# Patient Record
Sex: Male | Born: 1965 | Race: Black or African American | Hispanic: No | Marital: Single | Smoking: Current every day smoker
Health system: Southern US, Community
[De-identification: ages and names within clinical notes are randomized; demographics above are authoritative.]

## PROBLEM LIST (undated history)

## (undated) ENCOUNTER — Emergency Department (HOSPITAL_COMMUNITY): Discharge status: Absent without leave | Payer: Medicare HMO

## (undated) DIAGNOSIS — R011 Cardiac murmur, unspecified: Secondary | ICD-10-CM

## (undated) DIAGNOSIS — K284 Chronic or unspecified gastrojejunal ulcer with hemorrhage: Secondary | ICD-10-CM

## (undated) DIAGNOSIS — F39 Unspecified mood [affective] disorder: Secondary | ICD-10-CM

## (undated) DIAGNOSIS — F419 Anxiety disorder, unspecified: Secondary | ICD-10-CM

## (undated) DIAGNOSIS — C259 Malignant neoplasm of pancreas, unspecified: Secondary | ICD-10-CM

## (undated) DIAGNOSIS — F609 Personality disorder, unspecified: Secondary | ICD-10-CM

## (undated) DIAGNOSIS — I10 Essential (primary) hypertension: Secondary | ICD-10-CM

## (undated) DIAGNOSIS — K274 Chronic or unspecified peptic ulcer, site unspecified, with hemorrhage: Secondary | ICD-10-CM

## (undated) HISTORY — DX: Cardiac murmur, unspecified: R01.1

## (undated) HISTORY — DX: Unspecified mood (affective) disorder: F39

## (undated) HISTORY — PX: TONSILLECTOMY: SUR1361

## (undated) HISTORY — PX: CERVICAL FUSION: SHX112

## (undated) HISTORY — DX: Personality disorder, unspecified: F60.9

## (undated) HISTORY — DX: Anxiety disorder, unspecified: F41.9

---

## 2001-12-02 ENCOUNTER — Emergency Department (HOSPITAL_COMMUNITY): Admission: EM | Admit: 2001-12-02 | Discharge: 2001-12-02 | Payer: Self-pay | Admitting: Emergency Medicine

## 2002-04-05 ENCOUNTER — Emergency Department (HOSPITAL_COMMUNITY): Admission: EM | Admit: 2002-04-05 | Discharge: 2002-04-06 | Payer: Self-pay | Admitting: Emergency Medicine

## 2002-04-06 ENCOUNTER — Encounter: Payer: Self-pay | Admitting: Emergency Medicine

## 2002-04-16 ENCOUNTER — Emergency Department (HOSPITAL_COMMUNITY): Admission: EM | Admit: 2002-04-16 | Discharge: 2002-04-16 | Payer: Self-pay

## 2002-04-16 ENCOUNTER — Encounter: Payer: Self-pay | Admitting: Emergency Medicine

## 2002-10-17 ENCOUNTER — Emergency Department (HOSPITAL_COMMUNITY): Admission: EM | Admit: 2002-10-17 | Discharge: 2002-10-17 | Payer: Self-pay | Admitting: Emergency Medicine

## 2002-10-18 ENCOUNTER — Emergency Department (HOSPITAL_COMMUNITY): Admission: EM | Admit: 2002-10-18 | Discharge: 2002-10-18 | Payer: Self-pay | Admitting: Emergency Medicine

## 2003-01-07 ENCOUNTER — Emergency Department (HOSPITAL_COMMUNITY): Admission: EM | Admit: 2003-01-07 | Discharge: 2003-01-07 | Payer: Self-pay | Admitting: Emergency Medicine

## 2003-01-09 ENCOUNTER — Emergency Department (HOSPITAL_COMMUNITY): Admission: EM | Admit: 2003-01-09 | Discharge: 2003-01-09 | Payer: Self-pay | Admitting: Emergency Medicine

## 2003-01-14 ENCOUNTER — Emergency Department (HOSPITAL_COMMUNITY): Admission: AD | Admit: 2003-01-14 | Discharge: 2003-01-14 | Payer: Self-pay | Admitting: Family Medicine

## 2003-12-19 ENCOUNTER — Encounter: Admission: RE | Admit: 2003-12-19 | Discharge: 2003-12-19 | Payer: Self-pay | Admitting: Orthopaedic Surgery

## 2003-12-26 ENCOUNTER — Encounter: Admission: RE | Admit: 2003-12-26 | Discharge: 2003-12-26 | Payer: Self-pay | Admitting: Orthopaedic Surgery

## 2004-03-07 ENCOUNTER — Inpatient Hospital Stay (HOSPITAL_COMMUNITY): Admission: RE | Admit: 2004-03-07 | Discharge: 2004-03-11 | Payer: Self-pay | Admitting: Orthopaedic Surgery

## 2004-03-08 ENCOUNTER — Ambulatory Visit: Payer: Self-pay | Admitting: Physical Medicine & Rehabilitation

## 2004-03-15 ENCOUNTER — Emergency Department (HOSPITAL_COMMUNITY): Admission: EM | Admit: 2004-03-15 | Discharge: 2004-03-15 | Payer: Self-pay

## 2004-03-17 ENCOUNTER — Emergency Department (HOSPITAL_COMMUNITY): Admission: EM | Admit: 2004-03-17 | Discharge: 2004-03-17 | Payer: Self-pay | Admitting: Emergency Medicine

## 2004-04-24 ENCOUNTER — Encounter: Admission: RE | Admit: 2004-04-24 | Discharge: 2004-04-24 | Payer: Self-pay | Admitting: Neurology

## 2004-05-27 ENCOUNTER — Emergency Department (HOSPITAL_COMMUNITY): Admission: EM | Admit: 2004-05-27 | Discharge: 2004-05-27 | Payer: Self-pay | Admitting: Emergency Medicine

## 2004-06-14 ENCOUNTER — Emergency Department (HOSPITAL_COMMUNITY): Admission: EM | Admit: 2004-06-14 | Discharge: 2004-06-14 | Payer: Self-pay | Admitting: Emergency Medicine

## 2004-08-16 ENCOUNTER — Emergency Department (HOSPITAL_COMMUNITY): Admission: EM | Admit: 2004-08-16 | Discharge: 2004-08-16 | Payer: Self-pay | Admitting: Family Medicine

## 2004-08-26 ENCOUNTER — Emergency Department (HOSPITAL_COMMUNITY): Admission: EM | Admit: 2004-08-26 | Discharge: 2004-08-26 | Payer: Self-pay | Admitting: Emergency Medicine

## 2004-09-22 ENCOUNTER — Emergency Department (HOSPITAL_COMMUNITY): Admission: EM | Admit: 2004-09-22 | Discharge: 2004-09-22 | Payer: Self-pay | Admitting: Emergency Medicine

## 2004-10-11 ENCOUNTER — Emergency Department (HOSPITAL_COMMUNITY): Admission: EM | Admit: 2004-10-11 | Discharge: 2004-10-11 | Payer: Self-pay | Admitting: Emergency Medicine

## 2005-09-21 ENCOUNTER — Emergency Department (HOSPITAL_COMMUNITY): Admission: AC | Admit: 2005-09-21 | Discharge: 2005-09-21 | Payer: Self-pay

## 2005-12-26 ENCOUNTER — Ambulatory Visit: Payer: Self-pay | Admitting: Nurse Practitioner

## 2005-12-29 ENCOUNTER — Ambulatory Visit: Payer: Self-pay | Admitting: *Deleted

## 2006-11-18 ENCOUNTER — Encounter (INDEPENDENT_AMBULATORY_CARE_PROVIDER_SITE_OTHER): Payer: Self-pay | Admitting: *Deleted

## 2007-02-09 ENCOUNTER — Emergency Department (HOSPITAL_COMMUNITY): Admission: EM | Admit: 2007-02-09 | Discharge: 2007-02-09 | Payer: Self-pay | Admitting: Emergency Medicine

## 2007-10-10 ENCOUNTER — Emergency Department (HOSPITAL_COMMUNITY): Admission: EM | Admit: 2007-10-10 | Discharge: 2007-10-10 | Payer: Self-pay | Admitting: Emergency Medicine

## 2008-05-23 ENCOUNTER — Emergency Department (HOSPITAL_COMMUNITY): Admission: EM | Admit: 2008-05-23 | Discharge: 2008-05-23 | Payer: Self-pay | Admitting: Emergency Medicine

## 2008-09-18 ENCOUNTER — Ambulatory Visit: Payer: Self-pay | Admitting: Diagnostic Radiology

## 2008-09-18 ENCOUNTER — Emergency Department (HOSPITAL_BASED_OUTPATIENT_CLINIC_OR_DEPARTMENT_OTHER): Admission: EM | Admit: 2008-09-18 | Discharge: 2008-09-18 | Payer: Self-pay | Admitting: Emergency Medicine

## 2008-10-08 ENCOUNTER — Emergency Department (HOSPITAL_BASED_OUTPATIENT_CLINIC_OR_DEPARTMENT_OTHER): Admission: EM | Admit: 2008-10-08 | Discharge: 2008-10-08 | Payer: Self-pay | Admitting: Emergency Medicine

## 2009-03-03 HISTORY — PX: BACK SURGERY: SHX140

## 2009-06-22 ENCOUNTER — Emergency Department (HOSPITAL_BASED_OUTPATIENT_CLINIC_OR_DEPARTMENT_OTHER): Admission: EM | Admit: 2009-06-22 | Discharge: 2009-06-22 | Payer: Self-pay | Admitting: Emergency Medicine

## 2009-09-12 ENCOUNTER — Emergency Department (HOSPITAL_BASED_OUTPATIENT_CLINIC_OR_DEPARTMENT_OTHER): Admission: EM | Admit: 2009-09-12 | Discharge: 2009-09-12 | Payer: Self-pay | Admitting: Emergency Medicine

## 2009-09-12 ENCOUNTER — Ambulatory Visit: Payer: Self-pay | Admitting: Diagnostic Radiology

## 2010-02-05 ENCOUNTER — Emergency Department (HOSPITAL_BASED_OUTPATIENT_CLINIC_OR_DEPARTMENT_OTHER)
Admission: EM | Admit: 2010-02-05 | Discharge: 2010-02-05 | Payer: Self-pay | Source: Home / Self Care | Admitting: Emergency Medicine

## 2010-03-16 ENCOUNTER — Emergency Department (HOSPITAL_COMMUNITY)
Admission: EM | Admit: 2010-03-16 | Discharge: 2010-03-16 | Payer: Self-pay | Source: Home / Self Care | Admitting: Emergency Medicine

## 2010-05-22 ENCOUNTER — Emergency Department (HOSPITAL_COMMUNITY): Payer: Self-pay

## 2010-05-22 ENCOUNTER — Emergency Department (HOSPITAL_COMMUNITY)
Admission: EM | Admit: 2010-05-22 | Discharge: 2010-05-22 | Disposition: A | Discharge status: Home / Self Care | Payer: Self-pay | Attending: Emergency Medicine | Admitting: Emergency Medicine

## 2010-05-22 DIAGNOSIS — Y9302 Activity, running: Secondary | ICD-10-CM | POA: Insufficient documentation

## 2010-05-22 DIAGNOSIS — S93409A Sprain of unspecified ligament of unspecified ankle, initial encounter: Secondary | ICD-10-CM | POA: Insufficient documentation

## 2010-05-22 DIAGNOSIS — M79609 Pain in unspecified limb: Secondary | ICD-10-CM | POA: Insufficient documentation

## 2010-05-22 DIAGNOSIS — S90129A Contusion of unspecified lesser toe(s) without damage to nail, initial encounter: Secondary | ICD-10-CM | POA: Insufficient documentation

## 2010-05-22 DIAGNOSIS — M25569 Pain in unspecified knee: Secondary | ICD-10-CM | POA: Insufficient documentation

## 2010-05-22 DIAGNOSIS — S8000XA Contusion of unspecified knee, initial encounter: Secondary | ICD-10-CM | POA: Insufficient documentation

## 2010-05-22 DIAGNOSIS — M25579 Pain in unspecified ankle and joints of unspecified foot: Secondary | ICD-10-CM | POA: Insufficient documentation

## 2010-05-22 DIAGNOSIS — W010XXA Fall on same level from slipping, tripping and stumbling without subsequent striking against object, initial encounter: Secondary | ICD-10-CM | POA: Insufficient documentation

## 2010-05-22 DIAGNOSIS — S335XXA Sprain of ligaments of lumbar spine, initial encounter: Secondary | ICD-10-CM | POA: Insufficient documentation
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
## 2010-07-19 NOTE — Op Note (Signed)
NAMEKAISON, Fernando Diaz                 ACCOUNT NO.:  [id number]   MEDICAL RECORD NO.:  [id number]          PATIENT TYPE:  INP   LOCATION:  2550                         FACILITY:  [facility]   PHYSICIAN:  Sharolyn Douglas, M.D.        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   PROCEDURE:  1.  Anterior cervical diskectomy C5-6, C6-7.  2.  Anterior cervical arthrodesis C5-6, C6-7 and placement two allograft      prosthesis spacers packed with local autogenous bone graft.  3.  Anterior cervical plating C5-C7 using the spinal Concept system.   SURGEON:  Sharolyn Douglas, M.D.   ASSISTANT:  Verlin Fester, P.A.   ANESTHESIA:  General orotracheal.   COMPLICATIONS:  None.   INDICATIONS:  The patient is a pleasant 45 year old male with persistent  neck and bilateral upper extremity pain, left greater than right. He has  severe cervical spondylosis most advanced at C5-6 and C6-7 with spinal  stenosis and neuro foraminal narrowing. He has been refractory to  conservative treatment modalities and elected to undergo a CDF at C5-6, C6-7  in hopes of improving his symptoms. He knows the risks and benefits  including adjacent segment problems requiring additional surgery.   PROCEDURE:  The patient was properly identified in the holding area, taken  to the operating room, underwent general endotracheal anesthesia without  difficulty, given prophylactic IV antibiotics. Carefully positioned with his  neck in slight extension using the [place] head rest. 5 pounds of halter  traction applied. Neck prepped and draped in the usual sterile fashion.  Transverse incision made level of cricoid cartilage. Left side of the neck  dissection was carried sharply through the platysma. The antrum between the  SCM and strap muscles medially was developed down to the  prevertebral space.  The esophagus, trachea, carotid sheath were identified and protected at all  times. C5-6 level easily identifiable by a large anterior osteophyte. Spinal  needle placed to confirm location. Longus colli muscle elevated out of C5-6  and C6-7 disk spaces bilaterally. Deep Shadowline retractor placed. Anterior  osteophytes removed with Leksell rongeur. Caspar distraction pins placed in  C5, C6, and C7 vertebral bodies. Gentle distraction applied. Starting at C5-  6 diskectomy was carried back to the posterior longitudinal ligament. The  disk space was found to be narrowed and the material degenerative. High-  speed bur used to remove the cartilaginous endplates and take down the  uncovertebral spurs. A 2 mm Kerrison used to take down the posterior  longitudinal ligament and undercut the vertebral margins. Wide  foraminotomies were completed, we felt we had a good decompression of the  spinal canal and foramen at the level of the disk. The wound was irrigated  and then a 6 mm allograft prosthesis spacer was packed with local bone graft  obtained from the drill shavings. This was carefully countersunk 1 mm into  the C5-6 disk space. We  then turned our attention to performing a similar  procedure at C6-7. Again the disk was narrowed and the material was  degenerative. The cartilaginous endplates were drilled and the uncovertebral  joint taken down. Vertebral margins undercut. Wide foraminotomies completed.  7 mm allograft prosthesis spacer packed with local bone graft placed at the  C6-7 level. The Caspar distraction pins were removed. There was a good  interference bed. The weight was removed from the head. A 40 2 mm spinal  Concepts anterior cervical plate was placed from C5-C7 with six 13 mm  screws. We had excellent screw purchase. The bone quality was. The locking  mechanism was engaged. Wound was irrigated. Esophagus, trachea, carotid  sheath inspected. No apparent  complications. Deep Penrose drain left. The  platysma closed with a running 2-0 Vicryl suture. Subcutaneous layer closed  with interrupted 3-0 Vicryl followed by a running 4-0 subcuticular Vicryl  suture on the skin edges. Benzoin, Steri-Strips placed. Sterile dressing  applied. Soft collar placed. The patient was extubated without difficulty  and transferred to recovery in stable condition able to move his upper and  lower extremities.      MC/MEDQ  D:  03/07/2004  T:  03/07/2004  Job:  [id number]
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
## 2010-10-29 ENCOUNTER — Ambulatory Visit (HOSPITAL_BASED_OUTPATIENT_CLINIC_OR_DEPARTMENT_OTHER): Payer: Medicaid Other | Admitting: Physical Medicine & Rehabilitation

## 2010-10-29 ENCOUNTER — Encounter: Payer: Medicaid Other | Attending: Physical Medicine & Rehabilitation

## 2010-10-29 DIAGNOSIS — M961 Postlaminectomy syndrome, not elsewhere classified: Secondary | ICD-10-CM | POA: Insufficient documentation

## 2010-10-29 DIAGNOSIS — Z981 Arthrodesis status: Secondary | ICD-10-CM | POA: Insufficient documentation

## 2010-10-29 DIAGNOSIS — M542 Cervicalgia: Secondary | ICD-10-CM | POA: Insufficient documentation

## 2010-10-29 DIAGNOSIS — M79609 Pain in unspecified limb: Secondary | ICD-10-CM | POA: Insufficient documentation

## 2010-10-29 DIAGNOSIS — M549 Dorsalgia, unspecified: Secondary | ICD-10-CM | POA: Insufficient documentation

## 2010-10-29 DIAGNOSIS — R29898 Other symptoms and signs involving the musculoskeletal system: Secondary | ICD-10-CM | POA: Insufficient documentation

## 2010-11-11 NOTE — Progress Notes (Signed)
REASON FOR VISIT:  Neck pain and back pain.  HISTORY:  A 45 year old male who had originally injured himself in 2005. He had neck pain in right upper extremity pain as well as weakness in the left upper extremity evaluated by Dr. Sharolyn Douglas found to have a C5- C6, C6-C7 disk underwent ACDF at those levels March 07, 2004.  No six. He had no postoperative complications.  He has had subsequent surgery cervical level.  He thinks it was at a different level than the original, but I do not have those reports given that were done at [facility].  He has had ED visits postoperatively in 2006 as well as 2008 and 2009 primarily for neck pain.  He had his repeat surgery in 2010 on his neck.  He then underwent a lumbar fusion at L4-5 per Dr. Sharolyn Douglas in n August 2011.  His last lumbar spine films showed an L3-4 fusion read as.  However, in view of his last MRI that I had on disk looked well decompressed.  No adjacent level degeneration.  Looking through ED visits, no alcohol related visits.  He was going to a Pain Clinic, but had a breathalyzer test showing 0.006 alcohol level. He states that he was using mouth wash before his visit.  He does have a history of dental caries.  He denies any alcohol use.  He denies any drug use.  Oswestry score is 64%.  ALLERGIES:  To MORPHINE.  Currently out of pain medicine, has taken some amoxicillin for abscess tooth, tramadol as well as trazodone.  He does not have any tramadol left either.  His pain level is 7/10 including neck, shoulder area as well as back and left thigh.  He needs some help with meal prep, household duties and shopping.  He has bladder and bowel issues, chronic numbness, trouble walking, depression and coughing.  Denies smoking or drinking.  Denies illegal drug use.  PHYSICAL EXAMINATION:  VITAL SIGNS:  Blood pressure 142/92, pulse 73, respirations 20 and O2 sat 100% on room air. GENERAL:  No acute distress.  Mood  and affect appropriate. BACK:  His back has no tenderness to palpation along the lumbar paraspinal muscles.  No problems with his incisions.  Neck incisions looked well-healed.  Mildly hypertrophic scar.  Neck range of motion is limited by 25% forward flexion, extension, lateral rotation and bending. Lumbar forward flexion is about 50%, but when he straightens up he complains of catch with pain in his low back area.  Straight leg raising test is negative.  Deep tendon reflexes are normal.  Upper and lower extremity sensation normal.  Upper and lower extremity strength is normal.  Upper and lower extremity gait is slow.  He shuffles slightly widened base support.  No evidence of toe drag or knee instability.  IMPRESSION: 1. Lumbar post laminectomy syndrome.  This is her primary pain     complaint. 2. Cervical post laminectomy syndrome. 3. Questionable EtOH abuse.  He denies and there is no other medical     history to substantiate this.  He did have very low level     breathalyzer, questioned false positive.  I think at this point, we     will check urine drug screen including test for ethanol and if this     is negative for any illicit drugs, alcohol or non disclosed     opiates, we can trial him on his Norco that he states was working  well for him before and watch him closely with pill counts as well     as subsequent UDS. 4. I really do not think any formalized physical therapy needs at this     time, perhaps he would benefit from a TENS.  In terms of     interventional procedures, he may benefit from sacroiliac injection     versus facet injections. 5. See in [facility] 1 month.  If he has positive UDS, we will     really have nothing else to offer him given that he does not want     to try just nonnarcotic treatment program.     Erick Colace, M.D. Electronically Signed    AEK/MedQ D:  10/29/2010 11:21:20  T:  10/29/2010 12:53:51  Job #:  [id number]  cc:    Valetta Close, M.D.  Jackie Plum, M.D. Fax: [phone]

## 2010-11-26 ENCOUNTER — Encounter: Payer: Medicaid Other | Attending: Neurosurgery | Admitting: Neurosurgery

## 2010-11-26 DIAGNOSIS — M542 Cervicalgia: Secondary | ICD-10-CM | POA: Insufficient documentation

## 2010-11-26 DIAGNOSIS — M545 Low back pain, unspecified: Secondary | ICD-10-CM | POA: Insufficient documentation

## 2010-11-26 DIAGNOSIS — M961 Postlaminectomy syndrome, not elsewhere classified: Secondary | ICD-10-CM

## 2010-11-26 DIAGNOSIS — M25519 Pain in unspecified shoulder: Secondary | ICD-10-CM | POA: Insufficient documentation

## 2010-11-26 NOTE — Assessment & Plan Note (Signed)
HISTORY OF PRESENT ILLNESS:  This is a patient of Dr. Wynn Banker.  He followed up last month regarding some back and neck and shoulder pain. The patient reports no change in his pain but was here to follow up on his UDS which was consistent.  He rates his pain as 7-8.  General activity level was 10.  Pain is worse during the day and night.  Walking and sitting tend to aggravate.  Medication tends to help.  He does use a cane for ambulation.  He does not climb steps or drive.  He can walk about 15 minutes at a time.  He is on disability.  He needs help with meal prep, household duties, and shopping.  REVIEW OF SYSTEMS:  Notable for those difficulties as well as some weakness, bowel control issues, numbness, trouble with ambulation, spasms, sleep apnea, coughing, abdominal pain, diarrhea, and some depression.  No suicidal thoughts.  PAST MEDICAL HISTORY:  Unchanged.  SOCIAL HISTORY:  Single, lives alone.  FAMILY HISTORY:  Unchanged.  PHYSICAL EXAMINATION:  VITAL SIGNS:  His blood pressure is 157/86, pulse 111, respirations 18, and O2 sats 99 on room air. NEUROLOGIC:  His motor strength is 4.5/5 in lower extremities. Sensation appears to be intact.  Constitutionally, he is within normal limits.  He is alert and oriented x3 but slow to rise from seated position.  He does walk with a limp.  IMPRESSION: 1. Post laminectomy syndrome, lumbar. 2. Cervical post laminectomy syndrome. 3. He has consistent urine drug screen.  PLAN:  We will go ahead and start him on Norco 5/325 one p.o. b.i.d., 60 with no refill.  Otherwise, his questions were encouraged and answered. He will follow up here in the clinic with me in 1 month.     Raychell Holcomb L. Blima Dessert Electronically Signed    RLW/MedQ D:  11/26/2010 12:56:35  T:  11/26/2010 13:45:28  Job #:  [id number]

## 2010-11-29 LAB — RAPID URINE DRUG SCREEN, HOSP PERFORMED
Amphetamines: NOT DETECTED
Barbiturates: NOT DETECTED
Benzodiazepines: NOT DETECTED
Cocaine: POSITIVE — AB
Opiates: NOT DETECTED
Tetrahydrocannabinol: NOT DETECTED

## 2010-11-29 LAB — POCT CARDIAC MARKERS
CKMB, poc: 2.2
CKMB, poc: 2.4
CKMB, poc: 3
Myoglobin, poc: 54.6
Myoglobin, poc: 56.3
Myoglobin, poc: 92.7
Troponin i, poc: <0.05
Troponin i, poc: <0.05
Troponin i, poc: <0.05

## 2010-11-29 LAB — DIFFERENTIAL
Basophils Absolute: 0
Basophils Relative: 1
Eosinophils Absolute: 0.2
Eosinophils Relative: 4
Lymphocytes Relative: 29
Lymphs Abs: 1.2
Monocytes Absolute: 0.3
Monocytes Relative: 7
Neutro Abs: 2.5
Neutrophils Relative %: 59

## 2010-11-29 LAB — CBC
HCT: 37.6 — ABNORMAL LOW
Hemoglobin: 12.6 — ABNORMAL LOW
MCHC: 33.5
MCV: 85.2
Platelets: 192
RBC: 4.41
RDW: 14.6
WBC: 4.2

## 2010-11-29 LAB — BASIC METABOLIC PANEL
BUN: 5 — ABNORMAL LOW
CO2: 25
Calcium: 8.7
GFR calc non Af Amer: >60
Glucose, Bld: 99
Sodium: 130 — ABNORMAL LOW

## 2010-11-29 LAB — BASIC METABOLIC PANEL WITH GFR
Chloride: 99
Creatinine, Ser: 0.87
GFR calc Af Amer: >60
Potassium: 3.4 — ABNORMAL LOW

## 2010-12-18 ENCOUNTER — Ambulatory Visit: Payer: Medicaid Other | Admitting: Neurosurgery

## 2010-12-18 ENCOUNTER — Encounter: Payer: Medicare Other | Admitting: Neurosurgery

## 2010-12-18 DIAGNOSIS — R209 Unspecified disturbances of skin sensation: Secondary | ICD-10-CM | POA: Insufficient documentation

## 2010-12-18 DIAGNOSIS — M961 Postlaminectomy syndrome, not elsewhere classified: Secondary | ICD-10-CM | POA: Insufficient documentation

## 2010-12-18 DIAGNOSIS — M62838 Other muscle spasm: Secondary | ICD-10-CM | POA: Insufficient documentation

## 2010-12-18 DIAGNOSIS — M549 Dorsalgia, unspecified: Secondary | ICD-10-CM | POA: Insufficient documentation

## 2010-12-18 DIAGNOSIS — M542 Cervicalgia: Secondary | ICD-10-CM | POA: Insufficient documentation

## 2010-12-18 DIAGNOSIS — M25519 Pain in unspecified shoulder: Secondary | ICD-10-CM | POA: Insufficient documentation

## 2010-12-20 ENCOUNTER — Encounter: Payer: Medicare Other | Attending: Neurosurgery | Admitting: Neurosurgery

## 2010-12-20 DIAGNOSIS — M545 Low back pain, unspecified: Secondary | ICD-10-CM | POA: Insufficient documentation

## 2010-12-20 DIAGNOSIS — M25519 Pain in unspecified shoulder: Secondary | ICD-10-CM | POA: Insufficient documentation

## 2010-12-20 DIAGNOSIS — M542 Cervicalgia: Secondary | ICD-10-CM | POA: Insufficient documentation

## 2010-12-20 DIAGNOSIS — M961 Postlaminectomy syndrome, not elsewhere classified: Secondary | ICD-10-CM | POA: Insufficient documentation
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
## 2011-01-31 ENCOUNTER — Encounter: Payer: Medicare Other | Attending: Neurosurgery

## 2011-01-31 ENCOUNTER — Ambulatory Visit (HOSPITAL_BASED_OUTPATIENT_CLINIC_OR_DEPARTMENT_OTHER): Payer: Medicaid Other | Admitting: Physical Medicine & Rehabilitation

## 2011-01-31 DIAGNOSIS — M25519 Pain in unspecified shoulder: Secondary | ICD-10-CM | POA: Insufficient documentation

## 2011-01-31 DIAGNOSIS — M545 Low back pain, unspecified: Secondary | ICD-10-CM | POA: Insufficient documentation

## 2011-01-31 DIAGNOSIS — M542 Cervicalgia: Secondary | ICD-10-CM | POA: Insufficient documentation

## 2011-01-31 DIAGNOSIS — M961 Postlaminectomy syndrome, not elsewhere classified: Secondary | ICD-10-CM | POA: Insufficient documentation

## 2011-01-31 NOTE — Assessment & Plan Note (Signed)
REASON FOR VISIT:  Lumbar post laminectomy syndrome and cervical post laminectomy syndrome with chronic low back greater than neck pain.  A 45 year old male with history of a C5-C6 and C6-C7 disk herniations who underwent ACDF at those levels March 07, 2004.  He had no postoperative complications, but underwent subsequent surgery perhaps at another level.  He also underwent a lumbar fusion L4-L5 level, Max Cohen August 2011.  He has had no new medical problems in the interval time period.  His back pain does limit him in terms of his mobility.  His neck pain is aggravating sometimes going to his left shoulder.  His Oswestry score was 64% unchanged compared to prior.  He did not do a neck disability index.  His examination shows pain with extension of the lumbar spine, but not with flexion.  He has no significant tenderness in the paraspinal muscles, either in the cervical or lumbar area.  He does have some left upper trapezius tenderness.  His left shoulder has negative impingement sign.  Negative cross adduction test.  He has full range of motion.  His motor strength is 5/5 bilateral upper extremities with the exception of left deltoid, which is 4-/5 and partly limited by pain.  We trialed him on a muscle relaxant that made him feel "out of body." He has been out of his Norco for about 4-5 days.  His last fill date was December 20, 2010, so that this would be appropriate.  He states that the medication was helpful in alleviating his pain, the Norco 5/325.  His pain level is 7/10 described as burning, stabbing, aching both in the neck and back.  He could walk 50 minutes at a time.  He does climb steps.  He does not drive.  His review of systems positive for trouble walking, depression, anxiety. He does wear a back brace.  Social; single, nonsmoker, nondrinker.  Blood pressure 144/98, pulse 117, respirations 18, O2 sat 100% on room air, weight 195 pounds, height 71 inches.  In  general, no acute distress.  Mood and affect appropriate.  Gait is with a cane.  He has no evidence of toe drag or knee instability.  His neck range of motion is about 75% of normal range forward flexion, extension, right lateral bending and left lateral bending is 50% of normal as well as left rotation.  IMPRESSION: 1. Cervical post laminectomy syndrome.  This is resulting in some     shoulder pain.  I think this is referred rather than primary     shoulder. 2. Lumbar pain.  I think this is actually the more disabling of his     pain syndromes and his exam is consistent with lumbar facet     arthropathy, so he would be a good candidate for medial branch     blocks.  We will set him up for those 3. Norco 5/325 b.i.d.  No signs of aberrant drug behavior.  We will     continue monitor.     Erick Colace, M.D. Electronically Signed   AEK/MedQ D:  01/31/2011 13:08:09  T:  01/31/2011 22:53:47  Job #:  [id number]

## 2011-02-17 ENCOUNTER — Encounter: Payer: Medicaid Other | Admitting: Physical Medicine & Rehabilitation

## 2011-02-17 ENCOUNTER — Ambulatory Visit: Payer: Medicaid Other | Admitting: Physical Medicine & Rehabilitation

## 2011-03-03 ENCOUNTER — Encounter: Payer: Medicare Other | Attending: Neurosurgery

## 2011-03-03 ENCOUNTER — Ambulatory Visit: Payer: Medicaid Other | Admitting: Physical Medicine & Rehabilitation

## 2011-03-03 DIAGNOSIS — M961 Postlaminectomy syndrome, not elsewhere classified: Secondary | ICD-10-CM | POA: Insufficient documentation

## 2011-03-03 DIAGNOSIS — M542 Cervicalgia: Secondary | ICD-10-CM | POA: Insufficient documentation

## 2011-03-03 DIAGNOSIS — M25519 Pain in unspecified shoulder: Secondary | ICD-10-CM | POA: Insufficient documentation

## 2011-03-03 DIAGNOSIS — M545 Low back pain, unspecified: Secondary | ICD-10-CM | POA: Insufficient documentation

## 2011-03-06 ENCOUNTER — Encounter: Payer: Medicare Other | Attending: Neurosurgery | Admitting: Neurosurgery

## 2011-03-06 DIAGNOSIS — M961 Postlaminectomy syndrome, not elsewhere classified: Secondary | ICD-10-CM | POA: Insufficient documentation

## 2011-03-06 DIAGNOSIS — M545 Low back pain, unspecified: Secondary | ICD-10-CM | POA: Insufficient documentation

## 2011-03-06 DIAGNOSIS — M25519 Pain in unspecified shoulder: Secondary | ICD-10-CM | POA: Insufficient documentation

## 2011-03-06 DIAGNOSIS — M542 Cervicalgia: Secondary | ICD-10-CM | POA: Insufficient documentation

## 2011-03-10 ENCOUNTER — Ambulatory Visit: Payer: Medicare Other | Admitting: Physical Medicine & Rehabilitation

## 2011-04-02 ENCOUNTER — Other Ambulatory Visit: Payer: Self-pay | Admitting: Orthopaedic Surgery

## 2011-04-02 DIAGNOSIS — M542 Cervicalgia: Secondary | ICD-10-CM

## 2011-04-02 DIAGNOSIS — M549 Dorsalgia, unspecified: Secondary | ICD-10-CM

## 2011-05-16 ENCOUNTER — Emergency Department (HOSPITAL_BASED_OUTPATIENT_CLINIC_OR_DEPARTMENT_OTHER)
Admission: EM | Admit: 2011-05-16 | Discharge: 2011-05-16 | Disposition: A | Discharge status: Home / Self Care | Payer: Medicare Other | Attending: Emergency Medicine | Admitting: Emergency Medicine

## 2011-05-16 ENCOUNTER — Encounter (HOSPITAL_BASED_OUTPATIENT_CLINIC_OR_DEPARTMENT_OTHER): Payer: Self-pay | Admitting: *Deleted

## 2011-05-16 DIAGNOSIS — K0889 Other specified disorders of teeth and supporting structures: Secondary | ICD-10-CM

## 2011-05-16 DIAGNOSIS — F17200 Nicotine dependence, unspecified, uncomplicated: Secondary | ICD-10-CM | POA: Insufficient documentation

## 2011-05-16 DIAGNOSIS — K089 Disorder of teeth and supporting structures, unspecified: Secondary | ICD-10-CM | POA: Insufficient documentation

## 2011-05-16 MED ORDER — TRAMADOL HCL 50 MG PO TABS: 50.0000 mg | ORAL_TABLET | Freq: Four times a day (QID) | ORAL | Status: AC | PRN

## 2011-05-16 MED ORDER — PENICILLIN V POTASSIUM 250 MG PO TABS: 250.0000 mg | ORAL_TABLET | Freq: Four times a day (QID) | ORAL | Status: AC

## 2011-05-16 NOTE — Discharge Instructions (Signed)
Toothache Toothaches are usually caused by tooth decay (cavity). However, other causes of toothache include:  Gum disease.   Cracked tooth.   Cracked filling.   Injury.   Jaw problem (temporo mandibular joint or TMJ disorder).   Tooth abscess.   Root sensitivity.   Grinding.   Eruption problems.  Swelling and redness around a painful tooth often means you have a dental abscess. Pain medicine and antibiotics can help reduce symptoms, but you will need to see a dentist within the next few days to have your problem properly evaluated and treated. If tooth decay is the problem, you may need a filling or root canal to save your tooth. If the problem is more severe, your tooth may need to be pulled. SEEK IMMEDIATE MEDICAL CARE IF:  You cannot swallow.   You develop severe swelling, increased redness, or increased pain in your mouth or face.   You have a fever.   You cannot open your mouth adequately.  Document Released: 03/27/2004 Document Revised: 02/06/2011 Document Reviewed: 05/17/2009 Exit[employer] Patient Information 2012 [employer]. 

## 2011-05-16 NOTE — ED Notes (Signed)
Dental pain x 1 week. Right lower molar pain.

## 2011-05-16 NOTE — ED Provider Notes (Signed)
History     CSN: [id number]  Arrival date & time 05/16/11  1342   First MD Initiated Contact with Patient 05/16/11 1346      Chief Complaint  Patient presents with  . Dental Pain    (Consider location/radiation/quality/duration/timing/severity/associated sxs/prior treatment) HPI Comments: Pt c/o right lower dental pain time one week:pt states that the area is starting to swell  Patient is a 46 y.o. male presenting with tooth pain. The history is provided by the patient. No language interpreter was used.  Dental PainThe primary symptoms include mouth pain. Primary symptoms do not include headaches or fever. The symptoms began 5 to 7 days ago. The symptoms are worsening. The symptoms are new. The symptoms occur constantly.    History reviewed. No pertinent past medical history.  History reviewed. No pertinent past surgical history.  No family history on file.  History  Substance Use Topics  . Smoking status: Current Everyday Smoker -- 1.0 packs/day  . Smokeless tobacco: Not on file  . Alcohol Use: No      Review of Systems  Constitutional: Negative for fever.  HENT: Positive for dental problem.   Eyes: Negative.   Respiratory: Negative.   Cardiovascular: Negative.   Neurological: Negative for headaches.    Allergies  Review of patient's allergies indicates no known allergies.  Home Medications  No current outpatient prescriptions on file.  BP 126/87  Pulse 80  Temp(Src) 98 F (36.7 C) (Oral)  Resp 20  Wt 197 lb (89.359 kg)  SpO2 100%  Physical Exam  Nursing note and vitals reviewed. Constitutional: He appears well-developed and well-nourished.  HENT:  Head: Normocephalic.  Right Ear: External ear normal.  Left Ear: External ear normal.       Pt has redness and swelling noted to the right lower gum:no fluctuance to the area  Cardiovascular: Normal rate and regular rhythm.   Pulmonary/Chest: Effort normal and breath sounds normal.  Musculoskeletal:  Normal range of motion.  Neurological: He is alert.    ED Course  Procedures (including critical care time)  Labs Reviewed - No data to display No results found.   1. Toothache       MDM  Will treat for a toothache        Teressa Lower, NP 05/16/11 1414

## 2011-05-16 NOTE — ED Notes (Signed)
Right side of his face is swollen. C.o pain to his lower right molar.

## 2011-05-18 NOTE — ED Provider Notes (Signed)
Medical screening examination/treatment/procedure(s) were performed by non-physician practitioner and as supervising physician I was immediately available for consultation/collaboration.   Geoffery Lyons, MD 05/18/11 2123

## 2011-10-08 ENCOUNTER — Emergency Department (HOSPITAL_BASED_OUTPATIENT_CLINIC_OR_DEPARTMENT_OTHER)
Admission: EM | Admit: 2011-10-08 | Discharge: 2011-10-08 | Disposition: A | Discharge status: Home / Self Care | Payer: Medicare Other | Attending: Emergency Medicine | Admitting: Emergency Medicine

## 2011-10-08 ENCOUNTER — Encounter (HOSPITAL_BASED_OUTPATIENT_CLINIC_OR_DEPARTMENT_OTHER): Payer: Self-pay | Admitting: Family Medicine

## 2011-10-08 DIAGNOSIS — R195 Other fecal abnormalities: Secondary | ICD-10-CM | POA: Insufficient documentation

## 2011-10-08 DIAGNOSIS — K2970 Gastritis, unspecified, without bleeding: Secondary | ICD-10-CM

## 2011-10-08 DIAGNOSIS — R109 Unspecified abdominal pain: Secondary | ICD-10-CM | POA: Insufficient documentation

## 2011-10-08 DIAGNOSIS — Z8509 Personal history of malignant neoplasm of other digestive organs: Secondary | ICD-10-CM | POA: Insufficient documentation

## 2011-10-08 DIAGNOSIS — F17200 Nicotine dependence, unspecified, uncomplicated: Secondary | ICD-10-CM | POA: Insufficient documentation

## 2011-10-08 HISTORY — DX: Chronic or unspecified peptic ulcer, site unspecified, with hemorrhage: K27.4

## 2011-10-08 HISTORY — DX: Malignant neoplasm of pancreas, unspecified: C25.9

## 2011-10-08 HISTORY — DX: Chronic or unspecified gastrojejunal ulcer with hemorrhage: K28.4

## 2011-10-08 LAB — CBC WITH DIFFERENTIAL/PLATELET
Basophils Relative: 0 % (ref 0–1)
Eosinophils Absolute: 0.1 10*3/uL (ref 0.0–0.7)
Eosinophils Relative: 2 % (ref 0–5)
HCT: 38.1 % — ABNORMAL LOW (ref 39.0–52.0)
Hemoglobin: 13.7 g/dL (ref 13.0–17.0)
MCH: 28.2 pg (ref 26.0–34.0)
MCHC: 36 g/dL (ref 30.0–36.0)
Monocytes Absolute: 0.3 10*3/uL (ref 0.1–1.0)
Monocytes Relative: 7 % (ref 3–12)
RDW: 14.8 % (ref 11.5–15.5)

## 2011-10-08 MED ORDER — MORPHINE SULFATE 4 MG/ML IJ SOLN
4.0000 mg | Freq: Once | INTRAMUSCULAR | Status: DC
  Filled 2011-10-08: qty 1

## 2011-10-08 MED ORDER — SODIUM CHLORIDE 0.9 % IV BOLUS (SEPSIS): 1000.0000 mL | Freq: Once | INTRAVENOUS | Status: DC

## 2011-10-08 MED ORDER — ONDANSETRON 4 MG PO TBDP
4.0000 mg | ORAL_TABLET | Freq: Once | ORAL | Status: AC
  Administered 2011-10-08: 4 mg via ORAL
  Filled 2011-10-08: qty 1

## 2011-10-08 MED ORDER — ONDANSETRON HCL 4 MG PO TABS: 4.0000 mg | ORAL_TABLET | Freq: Three times a day (TID) | ORAL | Status: AC | PRN

## 2011-10-08 MED ORDER — PANTOPRAZOLE SODIUM 40 MG IV SOLR
40.0000 mg | Freq: Once | INTRAVENOUS | Status: DC
  Filled 2011-10-08: qty 40

## 2011-10-08 MED ORDER — FAMOTIDINE 40 MG PO TABS: 40.0000 mg | ORAL_TABLET | Freq: Every day | ORAL | Status: DC

## 2011-10-08 MED ORDER — TRAMADOL HCL 50 MG PO TABS: 50.0000 mg | ORAL_TABLET | Freq: Four times a day (QID) | ORAL | Status: AC | PRN

## 2011-10-08 MED ORDER — OXYCODONE-ACETAMINOPHEN 5-325 MG PO TABS
2.0000 | ORAL_TABLET | Freq: Once | ORAL | Status: AC
  Administered 2011-10-08: 2 via ORAL
  Filled 2011-10-08 (×2): qty 2

## 2011-10-08 MED ORDER — ONDANSETRON HCL 4 MG/2ML IJ SOLN
4.0000 mg | Freq: Once | INTRAMUSCULAR | Status: DC
  Filled 2011-10-08: qty 2

## 2011-10-08 NOTE — ED Provider Notes (Signed)
History     CSN: [id number]  Arrival date & time 10/08/11  1212   First MD Initiated Contact with Patient 10/08/11 1313      Chief Complaint  Patient presents with  . Abdominal Pain    (Consider location/radiation/quality/duration/timing/severity/associated sxs/prior treatment) HPI  46 y/o male with PMH significant for prior EtOH abuse and gastric ulcer c/o epigastric colicky pain gnawing 10/10  worsening over the course of 3 days. Not tied to food intake.  Associated with multiple episodes of nonbloody nonbilious vomiting. Patient is not tolerating by mouth at this moment. Pt also reports BRBPR x2 days. Pt took ibuprofen with little relief. Denies chest pain, shortness of breath, lightheaded on standing or palpitations.   GI Dr. Nedra Hai  Past Medical History  Diagnosis Date  . Pancreatic cancer   . Bleeding ulcer     Past Surgical History  Procedure Date  . Tonsillectomy   . Back surgery     No family history on file.  History  Substance Use Topics  . Smoking status: Current Everyday Smoker -- 1.0 packs/day  . Smokeless tobacco: Not on file  . Alcohol Use: No     h/o etoh abuse      Review of Systems  Unable to perform ROS Respiratory: Negative for shortness of breath.   Gastrointestinal: Positive for nausea, vomiting, abdominal pain and blood in stool.  Neurological: Negative for weakness.  All other systems reviewed and are negative.    Allergies  Review of patient's allergies indicates no known allergies.  Home Medications  No current outpatient prescriptions on file.  BP 126/94  Pulse 101  Temp 98.2 F (36.8 C) (Oral)  Resp 20  Ht 6\' 1"  (1.854 m)  Wt 200 lb (90.719 kg)  BMI 26.39 kg/m2  SpO2 95%  Physical Exam  Vitals reviewed. Constitutional: He is oriented to person, place, and time. He appears well-developed and well-nourished. No distress.  HENT:  Head: Normocephalic.  Eyes: Conjunctivae and EOM are normal. Pupils are equal, round, and  reactive to light.       Pink palpebral conjunctiva  Cardiovascular: Normal rate, regular rhythm and normal heart sounds.   Pulmonary/Chest: Effort normal and breath sounds normal. No respiratory distress. He has no wheezes. He has no rales. He exhibits no tenderness.  Abdominal: Soft. Bowel sounds are normal. He exhibits no distension and no mass. There is tenderness. There is no rebound and no guarding.       Tenderness to deep palpation of the epigastric region. Digital rectal exam shows good sphincter tone, no hemorrhoids appreciated, normal colored stool in vault.  Musculoskeletal: Normal range of motion.  Neurological: He is alert and oriented to person, place, and time.  Psychiatric: He has a normal mood and affect.    ED Course  Procedures (including critical care time)  Labs Reviewed  CBC WITH DIFFERENTIAL - Abnormal; Notable for the following:    HCT 38.1 (*)     All other components within normal limits  OCCULT BLOOD X 1 CARD TO LAB, STOOL   No results found.   No diagnosis found.    MDM  Likely bleeding gastric ulcer. Patient has no signs of perforation with acute abdomen evidenced by peritoneal signs. Guaiac and CBC pending  Fecal occult blood is positive, other blood test pending. Patient wants to leave because he says he must pick up his child. I convinced the patient to stay until his blood work is back. However, he refuses IV and IV  medication.  Hemoglobin is 13.75 with a hematocrit of 38.1 this is essentially unchanged from prior reading. No concern about anemia at this time.  Discussed case with attending who agrees with plan and stability to d/c to home.  Pt verbalized understanding and agrees with care plan. Outpatient follow-up and return precautions given.       Wynetta Emery, PA-C 10/08/11 1514

## 2011-10-08 NOTE — ED Provider Notes (Signed)
Medical screening examination/treatment/procedure(s) were performed by non-physician practitioner and as supervising physician I was immediately available for consultation/collaboration.   Liat Mayol III, MD 10/08/11 2241 

## 2011-10-08 NOTE — ED Notes (Signed)
Pt talking on cell phone, states "I don't know if I can wait here for all of this, I have to meet someone here in just a little while..." pt cont talking on cell phone, declines iv access at this time.

## 2011-10-08 NOTE — ED Notes (Signed)
Pt speaking with pa, refuses iv access or medications, pt states that his gf is waiting outside for him. Pt agrees to stay for lab results, but states "Just give me a prescription for something I can get on the way home.Marland KitchenMarland Kitchen"

## 2011-10-08 NOTE — ED Notes (Addendum)
1450 pa speaking with pt, encouraged to allow labs. Pt agrees to allow lab draw and requests po medication for pain.

## 2011-10-08 NOTE — ED Notes (Addendum)
Pt c/o abdominal pain x 3 days with h/o "pancreatic cancer and bleeding ulcers". Pt also c/o "blood on toilet paper" x 2 days. Pt has recent h/o substance and etoh abuse. Pt saw Dr. Dierdre Searles at [facility] about a year ago.

## 2011-12-31 ENCOUNTER — Emergency Department (HOSPITAL_COMMUNITY): Payer: Medicare Other

## 2011-12-31 ENCOUNTER — Emergency Department (HOSPITAL_COMMUNITY)
Admission: EM | Admit: 2011-12-31 | Discharge: 2011-12-31 | Disposition: A | Discharge status: Home / Self Care | Payer: Medicare Other | Attending: Emergency Medicine | Admitting: Emergency Medicine

## 2011-12-31 ENCOUNTER — Encounter (HOSPITAL_COMMUNITY): Payer: Self-pay | Admitting: Emergency Medicine

## 2011-12-31 DIAGNOSIS — Z8719 Personal history of other diseases of the digestive system: Secondary | ICD-10-CM | POA: Insufficient documentation

## 2011-12-31 DIAGNOSIS — F17200 Nicotine dependence, unspecified, uncomplicated: Secondary | ICD-10-CM | POA: Insufficient documentation

## 2011-12-31 DIAGNOSIS — R569 Unspecified convulsions: Secondary | ICD-10-CM | POA: Insufficient documentation

## 2011-12-31 DIAGNOSIS — Z8509 Personal history of malignant neoplasm of other digestive organs: Secondary | ICD-10-CM | POA: Insufficient documentation

## 2011-12-31 MED ORDER — LEVETIRACETAM 500 MG PO TABS: 500.0000 mg | ORAL_TABLET | Freq: Two times a day (BID) | ORAL | Status: DC

## 2011-12-31 MED ORDER — SODIUM CHLORIDE 0.9 % IV SOLN
1000.0000 mg | Freq: Once | INTRAVENOUS | Status: AC
  Administered 2011-12-31: 1000 mg via INTRAVENOUS
  Filled 2011-12-31: qty 10

## 2011-12-31 NOTE — ED Notes (Addendum)
Per EMS, patient from [facility] in [place].  Patient has been having seizures off and on for the past several days; patient has been in jail for three days.  Patient had head injury (four inch laceration to frontal lobe) prior to being in jail.  Patient has not been given any medications for his seizure history, per jail nurse.  Patient reports that he had a seizure once three years ago; this is his first seizure activity in three years.  Patient has 22 GA in left hand (started at jail).  Last seizure was at 2000 tonight.

## 2011-12-31 NOTE — ED Notes (Signed)
Patient currently resting quietly in bed; no respiratory or acute distress noted.  Patient updated on plan of care; informed patient that we are currently waiting on further orders from EDP; denies any needs at this time; will continue to monitor.

## 2011-12-31 NOTE — ED Notes (Signed)
Patient updated on plan of care; informed patient that Keppra will take 15 minutes to infuse.  Patient denies any other needs at this time; will continue to monitor.  Sheriffs at bedside.

## 2011-12-31 NOTE — ED Notes (Signed)
Patient feels that he left something of his over in CT (no belongings noted on patient upon ED arrival; patient does not exaggerate on what he left).  CT called; CT denies any belongings in CT room.  Will continue to monitor.

## 2011-12-31 NOTE — ED Notes (Addendum)
Sheriffs in room states that patient does not have his keys, so they would have not been left in CT.  Patient updated on plan of care; informed patient that we are currently waiting for EDP to come in and talk about test results.  Patient denies any other needs at this time; will continue to monitor.  Patient has one handcuff on right wrist at this time by sheriffs; sheriff states that it is their policy to keep one handcuff on at all times; charge RN aware of situation.

## 2011-12-31 NOTE — ED Notes (Signed)
Patient resting quietly in bed; no respiratory or acute distress noted.  Patient denies any needs at this time; no circulation problems with right arm (handcuffed by sheriff).  Patient updated on plan of care; informed patient that we are currently waiting on Keppra to be delivered from pharmacy; denies any other needs at this time; will continue to monitor.

## 2011-12-31 NOTE — ED Provider Notes (Signed)
History     CSN: [id number]  Arrival date & time 12/31/11  0117   First MD Initiated Contact with Patient 12/31/11 0208      Chief Complaint  Patient presents with  . Seizures    (Consider location/radiation/quality/duration/timing/severity/associated sxs/prior treatment) HPIDawone Diaz is a 46 y.o. male is incarcerated presents for multiple seizures. Patient has had seizures since sustaining head injury over a year ago this has more seizures while incarcerated over the last 3 days in [facility] jail. Patient says sometimes she is bit his tongue, he has urinated on himself and he's had a post ictal period. Not hurt himself today. He does present after having staples applied to his frontal scalp about 5 days ago. He's got no complaints right now. Seizures have been intermittent, he's not taking any seizure medicine, he says he has not been on any seizure medications.  Further history:patient had been placed on multiple benzodiazepine regimens while incarcerated however he is still taking benzodiazepines at the time of seizures.   Past Medical History  Diagnosis Date  . Pancreatic cancer   . Bleeding ulcer     Past Surgical History  Procedure Date  . Tonsillectomy   . Back surgery     History reviewed. No pertinent family history.  History  Substance Use Topics  . Smoking status: Current Every Day Smoker -- 1.0 packs/day  . Smokeless tobacco: Not on file  . Alcohol Use: No     h/o etoh abuse      Review of Systems At least 10pt or greater review of systems completed and are negative except where specified in the HPI.  Allergies  Review of patient's allergies indicates no known allergies.  Home Medications   Current Outpatient Rx  Name Route Sig Dispense Refill  . ALBUTEROL SULFATE HFA 108 (90 BASE) MCG/ACT IN AERS Inhalation Inhale 2 puffs into the lungs every 6 (six) hours as needed. For breathing    . ALPRAZOLAM 2 MG PO TABS Oral Take 2 mg by mouth 3 (three)  times daily.    . CELECOXIB 200 MG PO CAPS Oral Take 200 mg by mouth 2 (two) times daily.    Marland Kitchen ESOMEPRAZOLE MAGNESIUM 40 MG PO CPDR Oral Take 40 mg by mouth daily before breakfast.    . GABAPENTIN 300 MG PO CAPS Oral Take 300 mg by mouth 4 (four) times daily.    Marland Kitchen HYDROCODONE-ACETAMINOPHEN 10-325 MG PO TABS Oral Take 1 tablet by mouth 4 (four) times daily.    Marland Kitchen PREGABALIN 100 MG PO CAPS Oral Take 100 mg by mouth daily.    Marland Kitchen TIOTROPIUM BROMIDE MONOHYDRATE 18 MCG IN CAPS Inhalation Place 18 mcg into inhaler and inhale daily.    . TRAZODONE HCL 100 MG PO TABS Oral Take 100 mg by mouth at bedtime.      BP 130/77  Pulse 88  Temp 97.9 F (36.6 C) (Oral)  Resp 20  SpO2 97%  Physical Exam  PHYSICAL EXAM: VITAL SIGNS:  . Filed Vitals:   12/31/11 0123 12/31/11 0145 12/31/11 0215 12/31/11 0400  BP:  122/87 126/82 130/77  Pulse:  72 83 88  Temp:  97.5 F (36.4 C)  97.9 F (36.6 C)  TempSrc:  Oral  Oral  Resp:  16  20  SpO2: 97% 100% 100% 97%   CONSTITUTIONAL: Awake, oriented, appears non-toxic HENT: 4 cm laceration to the frontal scalp - 9 staples, normocephalic, oral mucosa pink and moist, airway patent. Nares patent without drainage. External  ears normal. EYES: Conjunctiva clear, EOMI, PERRLA NECK: Trachea midline, non-tender, supple CARDIOVASCULAR: Normal heart rate, Normal rhythm, No murmurs, rubs, gallops PULMONARY/CHEST: Clear to auscultation, no rhonchi, wheezes, or rales. Symmetrical breath sounds. CHEST WALL: No lesions. Non-tender. ABDOMINAL: Non-distended, soft, non-tender - no rebound or guarding.  BS normal. NEUROLOGIC: ZO:XWRUEA fields intact. PERRLA, EOMI.  Facial sensation equal to light touch bilaterally.  Good muscle bulk in the masseter muscle and good lateral movement of the jaw.  Facial expressions equal and good strength with smile/frown and puffed cheeks.  Hearing grossly intact to finger rub test.  Uvula, tongue are midline with no deviation. Symmetrical palate  elevation.  Trapezius and SCM muscles are 5/5 strength bilaterally.   DTR: Brachioradialis, biceps, patellar, Achilles tendon reflexes 2+ bilaterally.  No clonus. Strength: 5/5 strength flexors and extensors in the upper and lower extremities.  Grip strength, finger adduction/abduction 5/5. Sensation: Sensation intact distally to light touch Gait and Station: She walks unassisted EXTREMITIES: No clubbing, cyanosis, or edema SKIN: Warm, Dry, No erythema, No rash   ED Course  Procedures (including critical care time)  Labs Reviewed - No data to display Ct Head Wo Contrast  12/31/2011  *RADIOLOGY REPORT*  Clinical Data: Seizures.  CT HEAD WITHOUT CONTRAST  Technique:  Contiguous axial images were obtained from the base of the skull through the vertex without contrast.  Comparison: None.  Findings: There is no evidence of acute intracranial abnormality including infarct, hemorrhage, mass lesion, mass effect, midline shift or abnormal extra-axial fluid collection.  Staples fixing a scalp fracture anteriorly are noted.  The calvarium is intact. Imaged paranasal sinuses mastoid air cells are clear.  IMPRESSION:  1.  No acute intracranial abnormality. 2.  Scalp laceration with staples in place.   Original Report Authenticated By: Bernadene Bell. D'ALESSIO, M.D.      1. Seizures       MDM  Fernando Diaz is a 46 y.o. male presenting with seizures. Also bili of withdrawal seizures from benzodiazepines however he has been on a benzo regimen including Valium - suspect this is less likely. Obtain CT of the head without contrast which shows no acute intracranial abnormality.  Patient does have scalp laceration with staples in place-removed those and suture on thumb.    She'll be discharged back to jail in stable and in good condition - he'll be taking 500 mg of Keppra twice a day per neurology recommendations and will followup with them.   I explained the diagnosis and have given explicit precautions to return  to the ER including other new or worsening symptoms. The patient understands and accepts the medical plan as it's been dictated and I have answered their questions. Discharge instructions concerning home care and prescriptions have been given.  The patient is STABLE and is discharged to home in good condition.        Jones Skene, MD 12/31/11 5409

## 2012-08-03 ENCOUNTER — Emergency Department (HOSPITAL_BASED_OUTPATIENT_CLINIC_OR_DEPARTMENT_OTHER)
Admission: EM | Admit: 2012-08-03 | Discharge: 2012-08-04 | Disposition: A | Discharge status: Home / Self Care | Payer: Medicare Other | Attending: Emergency Medicine | Admitting: Emergency Medicine

## 2012-08-03 ENCOUNTER — Encounter (HOSPITAL_BASED_OUTPATIENT_CLINIC_OR_DEPARTMENT_OTHER): Payer: Self-pay | Admitting: *Deleted

## 2012-08-03 DIAGNOSIS — K2970 Gastritis, unspecified, without bleeding: Secondary | ICD-10-CM | POA: Insufficient documentation

## 2012-08-03 DIAGNOSIS — Z79899 Other long term (current) drug therapy: Secondary | ICD-10-CM | POA: Insufficient documentation

## 2012-08-03 DIAGNOSIS — Z8719 Personal history of other diseases of the digestive system: Secondary | ICD-10-CM | POA: Insufficient documentation

## 2012-08-03 DIAGNOSIS — Z8509 Personal history of malignant neoplasm of other digestive organs: Secondary | ICD-10-CM | POA: Insufficient documentation

## 2012-08-03 DIAGNOSIS — F17200 Nicotine dependence, unspecified, uncomplicated: Secondary | ICD-10-CM | POA: Insufficient documentation

## 2012-08-03 DIAGNOSIS — Z8711 Personal history of peptic ulcer disease: Secondary | ICD-10-CM | POA: Insufficient documentation

## 2012-08-03 DIAGNOSIS — R112 Nausea with vomiting, unspecified: Secondary | ICD-10-CM | POA: Insufficient documentation

## 2012-08-03 DIAGNOSIS — R5381 Other malaise: Secondary | ICD-10-CM | POA: Insufficient documentation

## 2012-08-03 LAB — CBC WITH DIFFERENTIAL/PLATELET
Basophils Absolute: 0 10*3/uL (ref 0.0–0.1)
Eosinophils Absolute: 0.2 10*3/uL (ref 0.0–0.7)
Eosinophils Relative: 3 % (ref 0–5)
HCT: 36.4 % — ABNORMAL LOW (ref 39.0–52.0)
MCH: 28 pg (ref 26.0–34.0)
MCV: 77.8 fL — ABNORMAL LOW (ref 78.0–100.0)
Monocytes Absolute: 0.6 10*3/uL (ref 0.1–1.0)
Platelets: 200 10*3/uL (ref 150–400)
RDW: 15.8 % — ABNORMAL HIGH (ref 11.5–15.5)

## 2012-08-03 LAB — URINALYSIS, ROUTINE W REFLEX MICROSCOPIC
Bilirubin Urine: NEGATIVE
Glucose, UA: NEGATIVE mg/dL
Ketones, ur: NEGATIVE mg/dL
Protein, ur: NEGATIVE mg/dL
Urobilinogen, UA: 0.2 mg/dL (ref 0.0–1.0)

## 2012-08-03 LAB — URINE MICROSCOPIC-ADD ON

## 2012-08-03 MED ORDER — ONDANSETRON HCL 4 MG/2ML IJ SOLN
4.0000 mg | Freq: Once | INTRAMUSCULAR | Status: AC
  Administered 2012-08-03: 4 mg via INTRAVENOUS
  Filled 2012-08-03: qty 2

## 2012-08-03 MED ORDER — ACETAMINOPHEN 325 MG PO TABS
650.0000 mg | ORAL_TABLET | Freq: Once | ORAL | Status: AC
  Administered 2012-08-03: 650 mg via ORAL
  Filled 2012-08-03: qty 2

## 2012-08-03 MED ORDER — SODIUM CHLORIDE 0.9 % IV BOLUS (SEPSIS)
1000.0000 mL | Freq: Once | INTRAVENOUS | Status: AC
  Administered 2012-08-03: 1000 mL via INTRAVENOUS

## 2012-08-03 MED ORDER — PANTOPRAZOLE SODIUM 40 MG IV SOLR
40.0000 mg | Freq: Once | INTRAVENOUS | Status: AC
  Administered 2012-08-03: 40 mg via INTRAVENOUS
  Filled 2012-08-03: qty 40

## 2012-08-03 NOTE — ED Notes (Signed)
MD at bedside. 

## 2012-08-03 NOTE — ED Notes (Addendum)
Pt abd pain with n/v x 1 day pt is from [facility]

## 2012-08-03 NOTE — ED Provider Notes (Signed)
History     CSN: [id number]  Arrival date & time 08/03/12  2257   First MD Initiated Contact with Patient 08/03/12 2312      Chief Complaint  Patient presents with  . Abdominal Pain    (Consider location/radiation/quality/duration/timing/severity/associated sxs/prior treatment) HPI Comments: Patient presents with a one-day history of nausea vomiting and abdominal pain. He has a history of gastritis, peptic ulcer disease and pancreatitis. He states he has a constant throbbing pain in his upper abdomen since yesterday. he's had nonbloody, nonbilious vomiting. He denies any change in bowel habits. He denies any fevers or chills. He denies any urinary complaints. He states this pain is similar to his episode of pancreatitis. He's not taking any medications for this at home. He has a history of alcohol abuse but states he has not had anything to drink in one month. He also requests age is not given any narcotic medications.  Patient is a 47 y.o. male presenting with abdominal pain.  Abdominal Pain Associated symptoms include abdominal pain. Pertinent negatives include no chest pain, no headaches and no shortness of breath.    Past Medical History  Diagnosis Date  . Pancreatic cancer   . Bleeding ulcer     Past Surgical History  Procedure Laterality Date  . Tonsillectomy    . Back surgery      History reviewed. No pertinent family history.  History  Substance Use Topics  . Smoking status: Current Every Day Smoker -- 1.00 packs/day    Types: Cigarettes  . Smokeless tobacco: Not on file  . Alcohol Use: No     Comment: h/o etoh abuse      Review of Systems  Constitutional: Positive for fatigue. Negative for fever, chills and diaphoresis.  HENT: Negative for congestion, rhinorrhea and sneezing.   Eyes: Negative.   Respiratory: Negative for cough, chest tightness and shortness of breath.   Cardiovascular: Negative for chest pain and leg swelling.  Gastrointestinal: Positive  for nausea, vomiting and abdominal pain. Negative for diarrhea and blood in stool.  Genitourinary: Negative for frequency, hematuria, flank pain and difficulty urinating.  Musculoskeletal: Negative for back pain and arthralgias.  Skin: Negative for rash.  Neurological: Negative for dizziness, speech difficulty, weakness, numbness and headaches.    Allergies  Review of patient's allergies indicates no known allergies.  Home Medications   Current Outpatient Rx  Name  Route  Sig  Dispense  Refill  . ondansetron (ZOFRAN ODT) 8 MG disintegrating tablet      8mg  ODT q4 hours prn nausea   4 tablet   0   . pantoprazole (PROTONIX) 20 MG tablet   Oral   Take 2 tablets (40 mg total) by mouth daily.   30 tablet   0     BP 131/88  Pulse 90  Temp(Src) 98.2 F (36.8 C)  Resp 16  Ht 6' (1.829 m)  Wt 200 lb (90.719 kg)  BMI 27.12 kg/m2  SpO2 99%  Physical Exam  Constitutional: He is oriented to person, place, and time. He appears well-developed and well-nourished.  HENT:  Head: Normocephalic and atraumatic.  Mouth/Throat: Oropharynx is clear and moist.  Eyes: Pupils are equal, round, and reactive to light.  Neck: Normal range of motion. Neck supple.  Cardiovascular: Normal rate, regular rhythm and normal heart sounds.   Pulmonary/Chest: Effort normal and breath sounds normal. No respiratory distress. He has no wheezes. He has no rales. He exhibits no tenderness.  Abdominal: Soft. Bowel sounds are normal.  There is tenderness (moderate tenderness to the epigastrium). There is no rebound and no guarding.  Musculoskeletal: Normal range of motion. He exhibits no edema.  Lymphadenopathy:    He has no cervical adenopathy.  Neurological: He is alert and oriented to person, place, and time.  Skin: Skin is warm and dry. No rash noted.  Psychiatric: He has a normal mood and affect.    ED Course  Procedures (including critical care time)  Results for orders placed during the hospital  encounter of 08/03/12  URINALYSIS, ROUTINE W REFLEX MICROSCOPIC      Result Value Range   Color, Urine YELLOW  YELLOW   APPearance TURBID (*) CLEAR   Specific Gravity, Urine 1.026  1.005 - 1.030   pH 6.5  5.0 - 8.0   Glucose, UA NEGATIVE  NEGATIVE mg/dL   Hgb urine dipstick SMALL (*) NEGATIVE   Bilirubin Urine NEGATIVE  NEGATIVE   Ketones, ur NEGATIVE  NEGATIVE mg/dL   Protein, ur NEGATIVE  NEGATIVE mg/dL   Urobilinogen, UA 0.2  0.0 - 1.0 mg/dL   Nitrite NEGATIVE  NEGATIVE   Leukocytes, UA SMALL (*) NEGATIVE  CBC WITH DIFFERENTIAL      Result Value Range   WBC 6.6  4.0 - 10.5 K/uL   RBC 4.68  4.22 - 5.81 MIL/uL   Hemoglobin 13.1  13.0 - 17.0 g/dL   HCT 16.1 (*) 09.6 - 04.5 %   MCV 77.8 (*) 78.0 - 100.0 fL   MCH 28.0  26.0 - 34.0 pg   MCHC 36.0  30.0 - 36.0 g/dL   RDW 40.9 (*) 81.1 - 91.4 %   Platelets 200  150 - 400 K/uL   Neutrophils Relative % 58  43 - 77 %   Neutro Abs 3.8  1.7 - 7.7 K/uL   Lymphocytes Relative 31  12 - 46 %   Lymphs Abs 2.1  0.7 - 4.0 K/uL   Monocytes Relative 8  3 - 12 %   Monocytes Absolute 0.6  0.1 - 1.0 K/uL   Eosinophils Relative 3  0 - 5 %   Eosinophils Absolute 0.2  0.0 - 0.7 K/uL   Basophils Relative 0  0 - 1 %   Basophils Absolute 0.0  0.0 - 0.1 K/uL  COMPREHENSIVE METABOLIC PANEL      Result Value Range   Sodium 139  135 - 145 mEq/L   Potassium 3.7  3.5 - 5.1 mEq/L   Chloride 103  96 - 112 mEq/L   CO2 25  19 - 32 mEq/L   Glucose, Bld 84  70 - 99 mg/dL   BUN 15  6 - 23 mg/dL   Creatinine, Ser 7.82  0.50 - 1.35 mg/dL   Calcium 9.6  8.4 - 95.6 mg/dL   Total Protein 7.3  6.0 - 8.3 g/dL   Albumin 4.0  3.5 - 5.2 g/dL   AST 20  0 - 37 U/L   ALT 18  0 - 53 U/L   Alkaline Phosphatase 94  39 - 117 U/L   Total Bilirubin 0.1 (*) 0.3 - 1.2 mg/dL   GFR calc non Af Amer 87 (*) >90 mL/min   GFR calc Af Amer >90  >90 mL/min  LIPASE, BLOOD      Result Value Range   Lipase 27  11 - 59 U/L  URINE MICROSCOPIC-ADD ON      Result Value Range    Squamous Epithelial / LPF FEW (*) RARE   WBC, UA  11-20  <3 WBC/hpf   RBC / HPF 3-6  <3 RBC/hpf   Bacteria, UA FEW (*) RARE   Urine-Other MUCOUS PRESENT     No results found.    1. Gastritis       MDM  Pt given zofran, IV fluids, protonix.  He's feeling much better. His vomiting and pain have resolved. His lipase is normal with no indication of pancreatitis. His repeat abdominal exam is benign. There is no suggestion of perforation. He has no hematemesis. He was discharged home in good condition. He was given a prescription for protonix and Zofran. He will schedule an appointment to followup with his gastroenterologist in [facility].        Rolan Bucco, MD 08/04/12 [phone]

## 2012-08-04 LAB — COMPREHENSIVE METABOLIC PANEL
ALT: 18 U/L (ref 0–53)
AST: 20 U/L (ref 0–37)
CO2: 25 mEq/L (ref 19–32)
Calcium: 9.6 mg/dL (ref 8.4–10.5)
Creatinine, Ser: 1 mg/dL (ref 0.50–1.35)
GFR calc Af Amer: >90 mL/min (ref >90)
GFR calc non Af Amer: 87 mL/min — ABNORMAL LOW (ref >90)
Glucose, Bld: 84 mg/dL (ref 70–99)
Sodium: 139 mEq/L (ref 135–145)
Total Protein: 7.3 g/dL (ref 6.0–8.3)

## 2012-08-04 MED ORDER — PANTOPRAZOLE SODIUM 20 MG PO TBEC: 40.0000 mg | DELAYED_RELEASE_TABLET | Freq: Every day | ORAL | Status: DC

## 2012-08-04 MED ORDER — ONDANSETRON 8 MG PO TBDP: ORAL_TABLET | ORAL | Status: DC

## 2012-08-04 NOTE — ED Notes (Signed)
[facility] called for pick up of pt

## 2012-08-05 LAB — URINE CULTURE
Colony Count: NO GROWTH
Culture: NO GROWTH

## 2012-11-15 DIAGNOSIS — M5417 Radiculopathy, lumbosacral region: Secondary | ICD-10-CM | POA: Insufficient documentation

## 2012-11-15 DIAGNOSIS — M542 Cervicalgia: Secondary | ICD-10-CM | POA: Insufficient documentation

## 2012-11-15 DIAGNOSIS — G894 Chronic pain syndrome: Secondary | ICD-10-CM | POA: Insufficient documentation

## 2012-11-15 DIAGNOSIS — M961 Postlaminectomy syndrome, not elsewhere classified: Secondary | ICD-10-CM | POA: Insufficient documentation

## 2013-02-14 ENCOUNTER — Telehealth: Payer: Self-pay

## 2013-02-14 NOTE — Telephone Encounter (Signed)
Message left on machine.  Please call for new patient appointment.   I will need proof of positivity. Pt may come to sign form for medical records release.   Laurell Josephs, RN

## 2013-02-24 ENCOUNTER — Emergency Department (HOSPITAL_BASED_OUTPATIENT_CLINIC_OR_DEPARTMENT_OTHER)
Admission: EM | Admit: 2013-02-24 | Discharge: 2013-02-24 | Disposition: A | Discharge status: Home / Self Care | Payer: Medicare Other | Attending: Emergency Medicine | Admitting: Emergency Medicine

## 2013-02-24 ENCOUNTER — Encounter (HOSPITAL_BASED_OUTPATIENT_CLINIC_OR_DEPARTMENT_OTHER): Payer: Self-pay | Admitting: Emergency Medicine

## 2013-02-24 DIAGNOSIS — R369 Urethral discharge, unspecified: Secondary | ICD-10-CM | POA: Insufficient documentation

## 2013-02-24 DIAGNOSIS — Z202 Contact with and (suspected) exposure to infections with a predominantly sexual mode of transmission: Secondary | ICD-10-CM | POA: Insufficient documentation

## 2013-02-24 DIAGNOSIS — Z8509 Personal history of malignant neoplasm of other digestive organs: Secondary | ICD-10-CM | POA: Insufficient documentation

## 2013-02-24 DIAGNOSIS — J029 Acute pharyngitis, unspecified: Secondary | ICD-10-CM | POA: Insufficient documentation

## 2013-02-24 DIAGNOSIS — F17200 Nicotine dependence, unspecified, uncomplicated: Secondary | ICD-10-CM | POA: Insufficient documentation

## 2013-02-24 DIAGNOSIS — Z8719 Personal history of other diseases of the digestive system: Secondary | ICD-10-CM | POA: Insufficient documentation

## 2013-02-24 LAB — URINALYSIS, ROUTINE W REFLEX MICROSCOPIC
Nitrite: NEGATIVE
Protein, ur: NEGATIVE mg/dL
Specific Gravity, Urine: 1.031 — ABNORMAL HIGH (ref 1.005–1.030)
Urobilinogen, UA: 1 mg/dL (ref 0.0–1.0)

## 2013-02-24 LAB — URINE MICROSCOPIC-ADD ON

## 2013-02-24 MED ORDER — AZITHROMYCIN 250 MG PO TABS
1000.0000 mg | ORAL_TABLET | Freq: Once | ORAL | Status: AC
  Administered 2013-02-24: 1000 mg via ORAL
  Filled 2013-02-24: qty 4

## 2013-02-24 MED ORDER — CEFTRIAXONE SODIUM 250 MG IJ SOLR
250.0000 mg | Freq: Once | INTRAMUSCULAR | Status: AC
  Administered 2013-02-24: 250 mg via INTRAMUSCULAR
  Filled 2013-02-24: qty 250

## 2013-02-24 MED ORDER — IBUPROFEN 800 MG PO TABS
800.0000 mg | ORAL_TABLET | Freq: Once | ORAL | Status: AC
  Administered 2013-02-24: 800 mg via ORAL
  Filled 2013-02-24: qty 1

## 2013-02-24 NOTE — ED Notes (Signed)
Pt reports he has penile d/c x 1 week and was exposed to an STD.  He also reports a sore throat since yesterday.

## 2013-02-24 NOTE — ED Notes (Signed)
MD at bedside. 

## 2013-02-24 NOTE — ED Provider Notes (Signed)
TIME SEEN: 10:11 AM  CHIEF COMPLAINT: Penile discharge for 1 week, sore throat since yesterday  HPI: Patient is a 47 y.o. male with a history of substance abuse and is currently at [facility] for rehabilitation who presents emergency department with one week of yellow penile discharge and one day of sore throat. He reports that 2 weeks ago he had unprotected sexual intercourse with his girlfriend who he believes was cheating on him. He is concerned that he may have a sexually transmitted disease. He states he has had gonorrhea once many years ago. He denies that he has had any fevers, chills, nausea, vomiting or diarrhea. He denies any dysuria, hematuria, testicular pain or swelling, scrotal masses. He states he is also had a sore throat more on the right side for the past day. He is worried that he may have spread his infection to his throat. He denies having oral sex recently. He is status post tonsillectomy and adenoidectomy.  ROS: See HPI Constitutional: no fever  Eyes: no drainage  ENT: no runny nose   Cardiovascular:  no chest pain  Resp: no SOB  GI: no vomiting GU: no dysuria Integumentary: no rash  Allergy: no hives  Musculoskeletal: no leg swelling  Neurological: no slurred speech ROS otherwise negative  PAST MEDICAL HISTORY/PAST SURGICAL HISTORY:  Past Medical History  Diagnosis Date  . Pancreatic cancer   . Bleeding ulcer     MEDICATIONS:  Prior to Admission medications   Medication Sig Start Date End Date Taking? Authorizing Provider  ondansetron (ZOFRAN ODT) 8 MG disintegrating tablet 8mg  ODT q4 hours prn nausea 08/04/12   Rolan Bucco, MD  pantoprazole (PROTONIX) 20 MG tablet Take 2 tablets (40 mg total) by mouth daily. 08/04/12   Rolan Bucco, MD    ALLERGIES:  No Known Allergies  SOCIAL HISTORY:  History  Substance Use Topics  . Smoking status: Current Every Day Smoker -- 1.00 packs/day    Types: Cigarettes  . Smokeless tobacco: Not on file  . Alcohol Use: Yes     Comment: last used 40 days ago    FAMILY HISTORY: No family history on file.  EXAM: BP 129/75  Pulse 90  Temp(Src) 98.5 F (36.9 C) (Oral)  Resp 16  Ht 6\' 1"  (1.854 m)  Wt 207 lb (93.895 kg)  BMI 27.32 kg/m2  SpO2 99% CONSTITUTIONAL: Alert and oriented and responds appropriately to questions. Well-appearing; well-nourished HEAD: Normocephalic EYES: Conjunctivae clear, PERRL ENT: normal nose; no rhinorrhea; moist mucous membranes; pharynx without lesions noted; no nasal drainage noted in the posterior oropharynx, patient is status post tonsillectomy, no uvular deviation or muffled voice or trismus or drooling NECK: Supple, no meningismus, no LAD  CARD: RRR; S1 and S2 appreciated; no murmurs, no clicks, no rubs, no gallops RESP: Normal chest excursion without splinting or tachypnea; breath sounds clear and equal bilaterally; no wheezes, no rhonchi, no rales,  ABD/GI: Normal bowel sounds; non-distended; soft, non-tender, no rebound, no guarding GU:  2+ femoral pulses bilaterally, normal external genitalia, no penile discharge, no testicular pain or swelling, no scrotal masses or hernia BACK:  The back appears normal and is non-tender to palpation, there is no CVA tenderness EXT: Normal ROM in all joints; non-tender to palpation; no edema; normal capillary refill; no cyanosis    SKIN: Normal color for age and race; warm NEURO: Moves all extremities equally PSYCH: The patient's mood and manner are appropriate. Grooming and personal hygiene are appropriate.  MEDICAL DECISION MAKING: Patient here with penile  discharge for 1 week and sore throat for 1 day. I do not feel the 2 are related. Will check urinalysis and gonorrhea and Chlamydia. Will give azithromycin and ceftriaxone given his concern for exposure to STDs. I feel his sore throat is likely due to a viral illness. Have discussed with patient that if he does have pharyngitis from STD that he will be treated with antibiotics in the ED.  Have also discussed that he will need to instruct his partner to be treated as well.  ED PROGRESS: Patient has leukocytes, bacteria and squamous cells in his urinalysis. Suspect this is do to possible sexually transmitted disease. He has received ceftriaxone and azithromycin the emergency department. We'll discharge home. Given return precautions and PCP followup information. Patient is comfortable plan.     Layla Maw Ward, DO 02/24/13 1054

## 2013-02-25 LAB — URINE CULTURE

## 2013-05-04 ENCOUNTER — Emergency Department (HOSPITAL_COMMUNITY): Payer: Medicare Other

## 2013-05-04 ENCOUNTER — Encounter (HOSPITAL_COMMUNITY): Payer: Self-pay | Admitting: Emergency Medicine

## 2013-05-04 ENCOUNTER — Emergency Department (HOSPITAL_COMMUNITY)
Admission: EM | Admit: 2013-05-04 | Discharge: 2013-05-04 | Disposition: A | Discharge status: Home / Self Care | Payer: Medicare Other | Attending: Emergency Medicine | Admitting: Emergency Medicine

## 2013-05-04 DIAGNOSIS — R109 Unspecified abdominal pain: Secondary | ICD-10-CM

## 2013-05-04 DIAGNOSIS — Z8509 Personal history of malignant neoplasm of other digestive organs: Secondary | ICD-10-CM | POA: Insufficient documentation

## 2013-05-04 DIAGNOSIS — Z9119 Patient's noncompliance with other medical treatment and regimen: Secondary | ICD-10-CM | POA: Insufficient documentation

## 2013-05-04 DIAGNOSIS — Z79899 Other long term (current) drug therapy: Secondary | ICD-10-CM | POA: Insufficient documentation

## 2013-05-04 DIAGNOSIS — F17200 Nicotine dependence, unspecified, uncomplicated: Secondary | ICD-10-CM | POA: Insufficient documentation

## 2013-05-04 DIAGNOSIS — R369 Urethral discharge, unspecified: Secondary | ICD-10-CM | POA: Insufficient documentation

## 2013-05-04 DIAGNOSIS — R112 Nausea with vomiting, unspecified: Secondary | ICD-10-CM

## 2013-05-04 DIAGNOSIS — Z711 Person with feared health complaint in whom no diagnosis is made: Secondary | ICD-10-CM

## 2013-05-04 DIAGNOSIS — K279 Peptic ulcer, site unspecified, unspecified as acute or chronic, without hemorrhage or perforation: Secondary | ICD-10-CM | POA: Insufficient documentation

## 2013-05-04 DIAGNOSIS — Z91199 Patient's noncompliance with other medical treatment and regimen due to unspecified reason: Secondary | ICD-10-CM | POA: Insufficient documentation

## 2013-05-04 LAB — CBC WITH DIFFERENTIAL/PLATELET
BASOS ABS: 0 10*3/uL (ref 0.0–0.1)
BASOS PCT: 0 % (ref 0–1)
EOS PCT: 2 % (ref 0–5)
Eosinophils Absolute: 0.1 10*3/uL (ref 0.0–0.7)
HEMATOCRIT: 39.9 % (ref 39.0–52.0)
Hemoglobin: 14 g/dL (ref 13.0–17.0)
LYMPHS PCT: 43 % (ref 12–46)
Lymphs Abs: 2.1 10*3/uL (ref 0.7–4.0)
MCH: 27.8 pg (ref 26.0–34.0)
MCHC: 35.1 g/dL (ref 30.0–36.0)
MCV: 79.3 fL (ref 78.0–100.0)
MONO ABS: 0.3 10*3/uL (ref 0.1–1.0)
MONOS PCT: 6 % (ref 3–12)
Neutro Abs: 2.4 10*3/uL (ref 1.7–7.7)
Neutrophils Relative %: 49 % (ref 43–77)
Platelets: 201 10*3/uL (ref 150–400)
RBC: 5.03 MIL/uL (ref 4.22–5.81)
RDW: 15.3 % (ref 11.5–15.5)
WBC: 5 10*3/uL (ref 4.0–10.5)

## 2013-05-04 LAB — COMPREHENSIVE METABOLIC PANEL
ALT: 16 U/L (ref 0–53)
AST: 23 U/L (ref 0–37)
Albumin: 4.2 g/dL (ref 3.5–5.2)
Alkaline Phosphatase: 78 U/L (ref 39–117)
BUN: 14 mg/dL (ref 6–23)
CALCIUM: 9.5 mg/dL (ref 8.4–10.5)
CO2: 24 meq/L (ref 19–32)
CREATININE: 0.98 mg/dL (ref 0.50–1.35)
Chloride: 104 mEq/L (ref 96–112)
GFR calc Af Amer: >90 mL/min (ref >90)
Glucose, Bld: 88 mg/dL (ref 70–99)
Potassium: 4.4 mEq/L (ref 3.7–5.3)
Sodium: 142 mEq/L (ref 137–147)
Total Bilirubin: <0.2 mg/dL — ABNORMAL LOW (ref 0.3–1.2)
Total Protein: 7.6 g/dL (ref 6.0–8.3)

## 2013-05-04 LAB — LIPASE, BLOOD: LIPASE: 22 U/L (ref 11–59)

## 2013-05-04 MED ORDER — AZITHROMYCIN 250 MG PO TABS
1000.0000 mg | ORAL_TABLET | Freq: Once | ORAL | Status: AC
  Administered 2013-05-04: 1000 mg via ORAL
  Filled 2013-05-04: qty 4

## 2013-05-04 MED ORDER — ONDANSETRON 4 MG PO TBDP
4.0000 mg | ORAL_TABLET | Freq: Once | ORAL | Status: AC
  Administered 2013-05-04: 4 mg via ORAL
  Filled 2013-05-04: qty 1

## 2013-05-04 MED ORDER — PANTOPRAZOLE SODIUM 20 MG PO TBEC: 20.0000 mg | DELAYED_RELEASE_TABLET | Freq: Every day | ORAL | Status: DC

## 2013-05-04 MED ORDER — PROMETHAZINE HCL 25 MG PO TABS: 25.0000 mg | ORAL_TABLET | Freq: Four times a day (QID) | ORAL | Status: DC | PRN

## 2013-05-04 MED ORDER — LIDOCAINE HCL 1 % IJ SOLN
INTRAMUSCULAR | Status: AC
  Administered 2013-05-04: 0.9 mL
  Filled 2013-05-04: qty 20

## 2013-05-04 MED ORDER — CEFTRIAXONE SODIUM 250 MG IJ SOLR
250.0000 mg | Freq: Once | INTRAMUSCULAR | Status: AC
  Administered 2013-05-04: 250 mg via INTRAMUSCULAR
  Filled 2013-05-04: qty 250

## 2013-05-04 MED ORDER — PANTOPRAZOLE SODIUM 40 MG PO TBEC
40.0000 mg | DELAYED_RELEASE_TABLET | Freq: Once | ORAL | Status: AC
  Administered 2013-05-04: 40 mg via ORAL
  Filled 2013-05-04: qty 1

## 2013-05-04 MED ORDER — GI COCKTAIL ~~LOC~~
30.0000 mL | Freq: Once | ORAL | Status: AC
  Administered 2013-05-04: 30 mL via ORAL
  Filled 2013-05-04: qty 30

## 2013-05-04 NOTE — Progress Notes (Signed)
   CARE MANAGEMENT ED NOTE 05/04/2013  Patient:  Fernando Diaz, Fernando Diaz   Account Number:  [id number]  Date Initiated:  05/04/2013  Documentation initiated by:  Livia Snellen  Subjective/Objective Assessment:   Patient presents to ED with abdominal pain and vomitting for two days.     Subjective/Objective Assessment Detail:   Patient with pancreatic cancer.     Action/Plan:   Action/Plan Detail:   Anticipated DC Date:       Status Recommendation to Physician:   Result of Recommendation:    Other ED [place]  Other  PCP issues    Choice offered to / List presented to:            Status of service:  Completed, signed off  ED Comments:   ED Comments Detail:  Patient confirms his pcp is Dr. Kirk Ruths Bonsu.  System updated.

## 2013-05-04 NOTE — Discharge Instructions (Signed)
Follow up with your primary care provider as soon as possible to discuss restarting medications for your stomach ulcers as well as any other ongoing healthcare needs including penile discharge and concern for STDs.

## 2013-05-04 NOTE — ED Notes (Signed)
Pt has been dx with gastric ulcers and has not seen GI md.  Pt states abdominal pain with vomiting x 2 days. Pain is increased more than normal.  Pt is not taking any GI meds.  Pt states he has been procrastinating.  Pt belches and has some relief.  Cannot describe vomit but does not states emesis is bloody.

## 2013-05-04 NOTE — ED Provider Notes (Signed)
CSN: [id number]     Arrival date & time 05/04/13  1348 History   First MD Initiated Contact with Patient 05/04/13 1649     Chief Complaint  Patient presents with  . Abdominal Pain  . Gastric Ulcer      (Consider location/radiation/quality/duration/timing/severity/associated sxs/prior Treatment) HPI Pt is a 48yo male with hx of pancreatic cancer and bleeding ulcer c/o worsening upper abdominal pain that started 2 days ago. Pt states he had increased nausea and vomiting in last 2 days. Last vomiting last night with streaks of red blood. Pain is intermittent, worse after vomiting and certain foods, sharp and aching "feels like a knot," 7/10, improves for a few minutes after belching.  Pt states he tries to stay away from sodas and does not drink alcohol. Admits to not taking his GI medication "a green pill" for over 3-4 months due to running out and missed his last GI f/u appointment because he did not have a ride. Pt also c/o penile discharge that started 1 week ago.  Pt admits to unprotected sex and is concerned for an STD. Pt would like to be checked and treated.  Denies fevers or diarrhea.     Past Medical History  Diagnosis Date  . Pancreatic cancer   . Bleeding ulcer    Past Surgical History  Procedure Laterality Date  . Tonsillectomy    . Back surgery     History reviewed. No pertinent family history. History  Substance Use Topics  . Smoking status: Current Every Day Smoker -- 1.00 packs/day    Types: Cigarettes  . Smokeless tobacco: Not on file  . Alcohol Use: Yes     Comment: last used 40 days ago    Review of Systems  Constitutional: Negative for fever and chills.  Respiratory: Negative for cough and shortness of breath.   Cardiovascular: Negative for chest pain.  Gastrointestinal: Positive for nausea, vomiting and abdominal pain. Negative for diarrhea, constipation and blood in stool.  Genitourinary: Positive for discharge. Negative for dysuria, urgency, hematuria,  flank pain, decreased urine volume, penile swelling, scrotal swelling, genital sores and testicular pain.  Musculoskeletal: Negative for back pain.  All other systems reviewed and are negative.      Allergies  Review of patient's allergies indicates no known allergies.  Home Medications   Current Outpatient Rx  Name  Route  Sig  Dispense  Refill  . pantoprazole (PROTONIX) 20 MG tablet   Oral   Take 1 tablet (20 mg total) by mouth daily.   30 tablet   0   . promethazine (PHENERGAN) 25 MG tablet   Oral   Take 1 tablet (25 mg total) by mouth every 6 (six) hours as needed for nausea or vomiting.   12 tablet   0    BP 129/83  Pulse 71  Temp(Src) 98.2 F (36.8 C) (Oral)  Resp 20  SpO2 99% Physical Exam  Nursing note and vitals reviewed. Constitutional: He appears well-developed and well-nourished.  HENT:  Head: Normocephalic and atraumatic.  Eyes: Conjunctivae are normal. No scleral icterus.  Neck: Normal range of motion. Neck supple.  Cardiovascular: Normal rate, regular rhythm and normal heart sounds.   Pulmonary/Chest: Effort normal and breath sounds normal. No respiratory distress. He has no wheezes. He has no rales. He exhibits no tenderness.  Abdominal: Soft. Bowel sounds are normal. He exhibits no distension and no mass. There is tenderness (epigastrium and LUQ). There is no rebound and no guarding.  Soft, non-distended, tender  in epigastrium and LUQ. No rebound, guarding, or masses.  Genitourinary: Testes normal and penis normal. Right testis shows no mass, no swelling and no tenderness. Left testis shows no mass, no swelling and no tenderness. No phimosis, paraphimosis, hypospadias, penile erythema or penile tenderness. No discharge found.  Chaperoned exam. Penis-no erythema, tenderness or discharge. Scrotum-no erythema, edema, tenderness or masses.  Musculoskeletal: Normal range of motion.  Neurological: He is alert.  Skin: Skin is warm and dry.    ED Course   Procedures (including critical care time) Labs Review Labs Reviewed  COMPREHENSIVE METABOLIC PANEL - Abnormal; Notable for the following:    Total Bilirubin <0.2 (*)    All other components within normal limits  GC/CHLAMYDIA PROBE AMP  CBC WITH DIFFERENTIAL  LIPASE, BLOOD  HIV ANTIBODY (ROUTINE TESTING)   Imaging Review Dg Abd 1 View  05/04/2013   CLINICAL DATA:  Abdominal pain.  Nausea and vomiting.  EXAM: ABDOMEN - 1 VIEW  COMPARISON:  No comparisons  FINDINGS: Moderate stool burden is present. The bowel gas pattern is nonobstructive. No gross plain film evidence of free air. L3-L4 PLIF noted. Phleboliths in the anatomic pelvis.  IMPRESSION: No acute abnormality.  Nonobstructive bowel gas pattern.   Electronically Signed   By: Dereck Ligas M.D.   On: 05/04/2013 17:52     EKG Interpretation None      MDM   Final diagnoses:  PUD (peptic ulcer disease)  Abdominal pain  Penile discharge  Concern about STD in male without diagnosis  Nausea & vomiting    pt with hx of gastric ulcer and medication non-compliance c/o epigastric pain nausea and vomiting x2 days. Pt also c/o penile discharge and requesting STD testing.  Pt tx empirically with azithromycin and rocephin.  Abdominal exam: soft, non-distended, tenderness in epigastrium.  Not concerned for surgical abdomen.  Low concern for bowel perforation, pt reports perfuse vomiting but appears well, non-toxic. No vomiting in ED. KUB performed: no acute abnormality. No obstructive bowel gas pattern.  Labs: CBC, CMP, Lipase: unremarkable.   GC/Chlamydia culture sent.  HIV antibody: pending.    Will discharge home. Strongly encouraged pt to f/u with PCP for medication refills and ongoing healthcare needs including STD testing.  Rx: protonix and phenergan.  Also advised pt to f/u with his GI specialist. Return precautions provided. Pt verbalized understanding and agreement with tx plan.     Noland Fordyce, PA-C 05/05/13 1252

## 2013-05-04 NOTE — ED Notes (Signed)
Pt also wants skin checked and checked for STD

## 2013-05-05 LAB — HIV ANTIBODY (ROUTINE TESTING W REFLEX): HIV: NONREACTIVE

## 2013-05-05 LAB — GC/CHLAMYDIA PROBE AMP
CT Probe RNA: NEGATIVE
GC Probe RNA: NEGATIVE

## 2013-05-06 NOTE — ED Provider Notes (Signed)
Medical screening examination/treatment/procedure(s) were performed by non-physician practitioner and as supervising physician I was immediately available for consultation/collaboration.   EKG Interpretation None       Richarda Blade, MD 05/06/13 [phone]

## 2013-09-08 ENCOUNTER — Emergency Department (HOSPITAL_COMMUNITY)
Admission: EM | Admit: 2013-09-08 | Discharge: 2013-09-08 | Disposition: A | Discharge status: Home / Self Care | Payer: Medicare Other | Attending: Emergency Medicine | Admitting: Emergency Medicine

## 2013-09-08 ENCOUNTER — Encounter (HOSPITAL_COMMUNITY): Payer: Self-pay | Admitting: Emergency Medicine

## 2013-09-08 DIAGNOSIS — R220 Localized swelling, mass and lump, head: Secondary | ICD-10-CM | POA: Diagnosis present

## 2013-09-08 DIAGNOSIS — F17200 Nicotine dependence, unspecified, uncomplicated: Secondary | ICD-10-CM | POA: Insufficient documentation

## 2013-09-08 DIAGNOSIS — R221 Localized swelling, mass and lump, neck: Principal | ICD-10-CM

## 2013-09-08 MED ORDER — DIPHENHYDRAMINE HCL 25 MG PO CAPS
50.0000 mg | ORAL_CAPSULE | Freq: Once | ORAL | Status: AC
  Administered 2013-09-08: 50 mg via ORAL
  Filled 2013-09-08: qty 2

## 2013-09-08 MED ORDER — EPINEPHRINE 0.3 MG/0.3ML IJ SOAJ
0.3000 mg | Freq: Once | INTRAMUSCULAR | Status: AC
  Administered 2013-09-08: 0.3 mg via INTRAMUSCULAR
  Filled 2013-09-08: qty 0.3

## 2013-09-08 MED ORDER — DIPHENHYDRAMINE HCL 25 MG PO TABS: 25.0000 mg | ORAL_TABLET | Freq: Four times a day (QID) | ORAL | Status: DC

## 2013-09-08 MED ORDER — HYDROCODONE-ACETAMINOPHEN 5-325 MG PO TABS
1.0000 | ORAL_TABLET | Freq: Once | ORAL | Status: AC
  Administered 2013-09-08: 1 via ORAL
  Filled 2013-09-08: qty 1

## 2013-09-08 MED ORDER — METHYLPREDNISOLONE SODIUM SUCC 125 MG IJ SOLR
125.0000 mg | Freq: Once | INTRAMUSCULAR | Status: AC
  Administered 2013-09-08: 125 mg via INTRAVENOUS
  Filled 2013-09-08: qty 2

## 2013-09-08 MED ORDER — FAMOTIDINE IN NACL 20-0.9 MG/50ML-% IV SOLN
20.0000 mg | Freq: Once | INTRAVENOUS | Status: AC
  Administered 2013-09-08: 20 mg via INTRAVENOUS
  Filled 2013-09-08: qty 50

## 2013-09-08 MED ORDER — DIPHENHYDRAMINE HCL 50 MG/ML IJ SOLN: 25.0000 mg | Freq: Once | INTRAMUSCULAR | Status: DC

## 2013-09-08 NOTE — ED Notes (Signed)
MD at bedside. 

## 2013-09-08 NOTE — ED Notes (Addendum)
Airway intact. Pt reports increased swelling to lower lip since this am. Pt has been taken Cialis for approx a week and an antibiotic that he is unsure of. Pt reports it feels like his lip is stretching. Pt just got a cat but states he has never been allergic. Does not take blood pressure medication. Pt states lips feels tingly, no swelling to tongue or throat at this time. Pt has not taken benadryl.

## 2013-09-08 NOTE — ED Provider Notes (Signed)
CSN: [id number]     Arrival date & time 09/08/13  1521 History   First MD Initiated Contact with Patient 09/08/13 1805     Chief Complaint  Patient presents with  . Oral Swelling     (Consider location/radiation/quality/duration/timing/severity/associated sxs/prior Treatment) HPI 48 year old male no significant past medical history the presents here today with swelling of the lower lip. Patient states he noticed minimal swelling this morning and the lipase continued to increase. Patient has been on antibiotic for the last one week. Patient is unsure of the knee but says that it was for a Chlamydia infection. Suspect that the antibiotic is doxycycline. Patient denies any shortness of breath or difficulty swallowing. He states that his tongue feels numb but there is no tongue swelling or uvula swelling. Patient states he was younger he would have comment outbreaks of urticaria rash for unknown reasons. States he's never had similar swelling in the past. Patient also endorses having a new cat, denies being on any ACE inhibitors.  History reviewed. No pertinent past medical history. History reviewed. No pertinent past surgical history. History reviewed. No pertinent family history. History  Substance Use Topics  . Smoking status: Current Every Day Smoker  . Smokeless tobacco: Not on file  . Alcohol Use: Not on file    Review of Systems  Constitutional: Negative for activity change.  HENT: Positive for facial swelling. Negative for congestion.   Eyes: Negative for visual disturbance.  Respiratory: Negative for cough and shortness of breath.   Cardiovascular: Negative for chest pain and leg swelling.  Gastrointestinal: Negative for abdominal pain and blood in stool.  Genitourinary: Negative for dysuria and hematuria.  Musculoskeletal: Negative for back pain.  Skin: Negative for color change.  Neurological: Negative for syncope and headaches.  Psychiatric/Behavioral: Negative for agitation.       Allergies  Review of patient's allergies indicates not on file.  Home Medications   Prior to Admission medications   Not on File   BP 112/82  Pulse 89  Temp(Src) 98.7 F (37.1 C) (Oral)  Resp 18  SpO2 98% Physical Exam  Nursing note and vitals reviewed. Constitutional: He is oriented to person, place, and time. He appears well-developed and well-nourished.  HENT:  Head: Normocephalic.  Notable swelling of the bottom lip.  Eyes: Pupils are equal, round, and reactive to light.  Neck: Neck supple.  Cardiovascular: Normal rate and regular rhythm.  Exam reveals no gallop and no friction rub.   No murmur heard. Pulmonary/Chest: Effort normal. No respiratory distress.  Abdominal: Soft. He exhibits no distension. There is no tenderness.  Musculoskeletal: He exhibits no edema.  Neurological: He is alert and oriented to person, place, and time.  Skin: Skin is warm.  Psychiatric: He has a normal mood and affect.    ED Course  Procedures (including critical care time) Labs Review Labs Reviewed - No data to display  Imaging Review No results found.   EKG Interpretation None      MDM   Final diagnoses:  Lip swelling   48 year old male no significant past medical history the presents today with angioedema of the bottom lip. Patient has not had any oral involvement, and no tongue swelling no uvula swelling patient is not having difficulty swallowing and no difficulty breathing. Patient was observed in the emergency department for multiple hours and there was no extension of the swelling into his mouth and no compromise of his airway. Patient treated with Solu-Medrol 125mg , Benadryl 50mg , and Famotidine 20mg  with  no improvement. Patient was then treated with IM epinephrine. Patient had some improvement after this treatment the symptoms are not fully resolved. Patient had a prolonged observation period in which the tongue and oral cavity did not become involved in the facial  swelling. At this point the patient was discharged home with prescription for schedule Benadryl. Patient instructed to her report back tomorrow for repeat examination and further instructed to follow up with an allergy specialist for further workup. The swelling possibly represents a hereditary angioedema or idiopathic angioedema. Patient also decided to discontinue the antibiotic which could potentially be contributing to the angioedema as an allergic cause.    Claudean Severance, MD 09/09/13 6734

## 2013-09-08 NOTE — Discharge Instructions (Signed)
-   stop antibiotic  - take scheduled benadryl  - follow-up with allergist for further work-up

## 2013-09-08 NOTE — ED Notes (Signed)
Dr Beaton at bedside 

## 2013-09-08 NOTE — ED Notes (Signed)
No increase in swelling

## 2013-09-09 NOTE — ED Provider Notes (Signed)
I saw and evaluated the patient, reviewed the resident's note and I agree with the findings and plan.   .Face to face Exam:  General:  Awake HEENT:  Atraumatic Resp:  Normal effort Abd:  Nondistended Neuro:No focal weakness  Dot Lanes, MD 09/09/13 [phone]

## 2013-10-27 ENCOUNTER — Encounter (HOSPITAL_COMMUNITY): Payer: Self-pay | Admitting: Emergency Medicine

## 2013-10-27 ENCOUNTER — Emergency Department (HOSPITAL_COMMUNITY)
Admission: EM | Admit: 2013-10-27 | Discharge: 2013-10-27 | Disposition: A | Discharge status: Home / Self Care | Payer: Medicare PPO | Attending: Emergency Medicine | Admitting: Emergency Medicine

## 2013-10-27 DIAGNOSIS — F17200 Nicotine dependence, unspecified, uncomplicated: Secondary | ICD-10-CM | POA: Insufficient documentation

## 2013-10-27 DIAGNOSIS — Z8509 Personal history of malignant neoplasm of other digestive organs: Secondary | ICD-10-CM | POA: Insufficient documentation

## 2013-10-27 DIAGNOSIS — N342 Other urethritis: Secondary | ICD-10-CM | POA: Insufficient documentation

## 2013-10-27 DIAGNOSIS — K0889 Other specified disorders of teeth and supporting structures: Secondary | ICD-10-CM

## 2013-10-27 DIAGNOSIS — Z202 Contact with and (suspected) exposure to infections with a predominantly sexual mode of transmission: Secondary | ICD-10-CM | POA: Insufficient documentation

## 2013-10-27 DIAGNOSIS — K089 Disorder of teeth and supporting structures, unspecified: Secondary | ICD-10-CM | POA: Insufficient documentation

## 2013-10-27 MED ORDER — CEFTRIAXONE SODIUM 250 MG IJ SOLR
250.0000 mg | Freq: Once | INTRAMUSCULAR | Status: AC
  Administered 2013-10-27: 250 mg via INTRAMUSCULAR
  Filled 2013-10-27: qty 250

## 2013-10-27 MED ORDER — AMOXICILLIN 500 MG PO CAPS: 500.0000 mg | ORAL_CAPSULE | Freq: Three times a day (TID) | ORAL | Status: DC

## 2013-10-27 MED ORDER — AZITHROMYCIN 250 MG PO TABS
1000.0000 mg | ORAL_TABLET | Freq: Once | ORAL | Status: AC
  Administered 2013-10-27: 1000 mg via ORAL
  Filled 2013-10-27: qty 4

## 2013-10-27 NOTE — ED Provider Notes (Signed)
  Chief Complaint   Chief Complaint  Patient presents with  . Exposure to STD    History of Present Illness   Fernando Diaz is a 48 year old male who had unprotected sex 4 days ago. Yesterday he developed a urethral discharge. He denies any dysuria or penile pain. He has not had any penile lesions, inguinal adenopathy, or testicular pain or swelling. He denies any fever, chills, skin rash, or joint pain.  His other problem has been dental pain in the first premolar on the right and the second incisor on the right. His teeth are intact. They're just painful. He has no difficulty opening his mouth, although does hurt to chew on that side.  Review of Systems   Other than as noted above, the patient denies any of the following symptoms: Systemic:  No fevers chills, arthralgias, or adenopathy. GI:  No abdominal pain, nausea or vomiting. GU:  No dysuria, penile pain, discharge, itching, dysuria, genital lesions, testicular pain or swelling. Skin:  No rash or itching.  [place]   Past medical history, family history, social history, meds, and allergies were reviewed.   Physical Examination    Vital signs:  BP 127/64  Pulse 104  Temp(Src) 97.5 F (36.4 C) (Oral)  Resp 20  Ht 6\' 1"  (1.854 m)  Wt 205 lb (92.987 kg)  BMI 27.05 kg/m2  SpO2 98% Gen:  Alert, oriented, in no distress. Mouth exam: The pharynx was clear. The teeth as mentioned above are intact without any obvious decay and no gingival swelling. There is no swelling of the floor the mouth. Abdomen:  Soft and flat, non-distended, and non-tender.  No organomegaly or mass. Genital:  Normal exam, no urethral discharge, no penile lesions, no inguinal adenopathy, testes normal. Skin:  Warm and dry.  No rash.   Labs   DNA probes for gonorrhea, Chlamydia, and Trichomonas obtained. Patient declines HIV and syphilis testing.  Course in Urgent Care Center   Given Rocephin 250 mg IM and azithromycin 1000 mg by mouth.  Assessment    The primary encounter diagnosis was Urethritis. A diagnosis of Pain, dental was also pertinent to this visit.  Plan    1.  Meds:  The following meds were prescribed:   New Prescriptions   AMOXICILLIN (AMOXIL) 500 MG CAPSULE    Take 1 capsule (500 mg total) by mouth 3 (three) times daily.    2.  Patient Education/Counseling:  The patient was given appropriate handouts, self care instructions, and instructed in symptomatic relief.The patient was instructed to inform all sexual contacts, avoid intercourse completely for 2 weeks and then only with a condom.  The patient was told that we would call about all abnormal lab results, and that we would need to report certain kinds of infection to the health department.    3.  Follow up:  The patient was told to follow up here if no better in 3 to 4 days, or sooner if becoming worse in any way, and given some red flag symptoms such as fever, pain, or difficulty urinating which would prompt immediate return.       Harden Mo, MD 10/27/13 [phone]

## 2013-10-27 NOTE — Discharge Instructions (Signed)
Urethritis Urethritis is an inflammation of the tube through which urine exits your bladder (urethra).  CAUSES Urethritis is often caused by an infection in your urethra. The infection can be viral, like herpes. The infection can also be bacterial, like gonorrhea. RISK FACTORS Risk factors of urethritis include:  Having sex without using a condom.  Having multiple sexual partners.  Having poor hygiene. SIGNS AND SYMPTOMS Symptoms of urethritis are less noticeable in women than in men. These symptoms include:  Burning feeling when you urinate (dysuria).  Discharge from your urethra.  Blood in your urine (hematuria).  Urinating more than usual. DIAGNOSIS  To confirm a diagnosis of urethritis, your health care provider will do the following:  Ask about your sexual history.  Perform a physical exam.  Have you provide a sample of your urine for lab testing.  Use a cotton swab to gently collect a sample from your urethra for lab testing. TREATMENT  It is important to treat urethritis. Depending on the cause, untreated urethritis may lead to serious genital infections and possibly infertility. Urethritis caused by a bacterial infection is treated with antibiotic medicine. All sexual partners must be treated.  HOME CARE INSTRUCTIONS  Do not have sex until the test results are known and treatment is completed, even if your symptoms go away before you finish treatment.  If you were prescribed an antibiotic, finish it all even if you start to feel better. SEEK MEDICAL CARE IF:   Your symptoms are not improved in 3 days.  Your symptoms are getting worse.  You develop abdominal pain or pelvic pain (in women).  You develop joint pain.  You have a fever. SEEK IMMEDIATE MEDICAL CARE IF:   You have severe pain in the belly, back, or side.  You have repeated vomiting. MAKE SURE YOU:  Understand these instructions.  Will watch your condition.  Will get help right away if you  are not doing well or get worse. Document Released: 08/13/2000 Document Revised: 07/04/2013 Document Reviewed: 10/18/2012 [facility] Patient Information 2015 [place], [place]. This information is not intended to replace advice given to you by your health care provider. Make sure you discuss any questions you have with your health care provider.  Look up the [place] of [facility] for free dental clinics. undoomedical.com.asp  Get there early and be prepared to wait. [employer] and [employer] have [employer], advertising schools that provide low cost routine dental care.   Other resources: [facility] [place], [place] [phone]  Patients with Medicaid: [place] [address] [place] Phone:  [phone]                                                  Phone:  [phone]  Dr. Ardyth Harps [address] [phone]  If unable to pay or uninsured, contact:  [place] or [facility]. to become qualified for the adult dental clinic.  No matter what dental problem you have, it  will not get better unless you get good dental care.  If the tooth is not taken care of, your symptoms will come back in time and you will be visiting [place] again in the [facility] [place] with a bad toothache.  So, see your dentist as soon as possible.  If you don't have a dentist, we can give you a list of dentists.  Sometimes the most cost effective treatment is removal of the tooth.  This can be done very inexpensively through one of the low cost Dance movement psychotherapist such as the facility on [facility] in [place] [phone]).  The downside to this is that you will have one less tooth and this can effect your ability to chew.  Some other things that can be done for a dental infection include the  following:   Rinse your mouth out with hot salt water (1/2 tsp of table salt and a pinch of baking soda in 8 oz of hot water).  You can do this every 2 or 3 hours.  Avoid cold foods, beverages, and cold air.  This will make your symptoms worse.  Sleep with your head elevated.  Sleeping flat will cause your gums and oral tissues to swell and make them hurt more.  You can sleep on several pillows.  Even better is to sleep in a recliner with your head higher than your heart.  For mild to moderate pain, you can take Tylenol, ibuprofen, or Aleve.  External application of heat by a heating pad, hot water bottle, or hot wet towel can help with pain and speed healing.  You can do this every 2 to 3 hours. Do not fall asleep on a heating pad since this can cause a burn.

## 2013-10-27 NOTE — ED Notes (Signed)
Patient states has unprotected sex last weekend.  Patient states he now has discharge.   Patient denies any other symptoms.

## 2014-01-10 ENCOUNTER — Encounter (HOSPITAL_COMMUNITY): Payer: Self-pay | Admitting: Emergency Medicine

## 2014-01-10 ENCOUNTER — Other Ambulatory Visit (HOSPITAL_COMMUNITY)
Admission: RE | Admit: 2014-01-10 | Discharge: 2014-01-10 | Disposition: A | Discharge status: Home / Self Care | Payer: Medicare PPO | Source: Ambulatory Visit | Attending: Family Medicine | Admitting: Family Medicine

## 2014-01-10 ENCOUNTER — Emergency Department (INDEPENDENT_AMBULATORY_CARE_PROVIDER_SITE_OTHER)
Admission: EM | Admit: 2014-01-10 | Discharge: 2014-01-10 | Disposition: A | Discharge status: Home / Self Care | Payer: Medicare PPO | Source: Home / Self Care | Attending: Family Medicine | Admitting: Family Medicine

## 2014-01-10 DIAGNOSIS — B86 Scabies: Secondary | ICD-10-CM

## 2014-01-10 DIAGNOSIS — Z113 Encounter for screening for infections with a predominantly sexual mode of transmission: Secondary | ICD-10-CM | POA: Insufficient documentation

## 2014-01-10 DIAGNOSIS — N342 Other urethritis: Secondary | ICD-10-CM | POA: Diagnosis not present

## 2014-01-10 LAB — RPR

## 2014-01-10 LAB — HIV ANTIBODY (ROUTINE TESTING W REFLEX): HIV 1&2 Ab, 4th Generation: NONREACTIVE

## 2014-01-10 MED ORDER — LIDOCAINE HCL (PF) 1 % IJ SOLN
INTRAMUSCULAR | Status: AC
  Filled 2014-01-10: qty 5

## 2014-01-10 MED ORDER — PERMETHRIN 5 % EX CREA: TOPICAL_CREAM | CUTANEOUS | Status: DC

## 2014-01-10 MED ORDER — CEFTRIAXONE SODIUM 250 MG IJ SOLR
250.0000 mg | Freq: Once | INTRAMUSCULAR | Status: AC
  Administered 2014-01-10: 250 mg via INTRAMUSCULAR

## 2014-01-10 MED ORDER — CEFTRIAXONE SODIUM 250 MG IJ SOLR
INTRAMUSCULAR | Status: AC
  Filled 2014-01-10: qty 250

## 2014-01-10 MED ORDER — AZITHROMYCIN 250 MG PO TABS
ORAL_TABLET | ORAL | Status: AC
  Filled 2014-01-10: qty 4

## 2014-01-10 MED ORDER — AZITHROMYCIN 250 MG PO TABS
1000.0000 mg | ORAL_TABLET | Freq: Once | ORAL | Status: AC
  Administered 2014-01-10: 1000 mg via ORAL

## 2014-01-10 NOTE — ED Provider Notes (Signed)
CSN: [id number]     Arrival date & time 01/10/14  0941 History   First MD Initiated Contact with Patient 01/10/14 1008     Chief Complaint  Patient presents with  . SEXUALLY TRANSMITTED DISEASE   (Consider location/radiation/quality/duration/timing/severity/associated sxs/prior Treatment) HPI Comments: Rash x 3 weeks. Pruritic. Began on hands. Now on upper extremities, torso and groin area Penile discharge x 24 hours   Past Medical History  Diagnosis Date  . Pancreatic cancer    History reviewed. No pertinent past surgical history. History reviewed. No pertinent family history. History  Substance Use Topics  . Smoking status: Current Every Day Smoker -- 0.10 packs/day    Types: Cigarettes  . Smokeless tobacco: Not on file  . Alcohol Use: Yes    Review of Systems  Constitutional: Negative for fever, chills and fatigue.  HENT: Negative.   Respiratory: Negative.   Cardiovascular: Negative.   Gastrointestinal: Negative.   Genitourinary: Positive for discharge. Negative for dysuria, urgency, frequency, hematuria, flank pain, decreased urine volume, penile swelling, scrotal swelling and penile pain.  Musculoskeletal: Negative for back pain.  Skin: Positive for rash.  Allergic/Immunologic: Negative for immunocompromised state.    Allergies  Morphine and related  Home Medications   Prior to Admission medications   Medication Sig Start Date End Date Taking? Authorizing Provider  amoxicillin (AMOXIL) 500 MG capsule Take 1 capsule (500 mg total) by mouth 3 (three) times daily. 10/27/13   Harden Mo, MD  permethrin (ELIMITE) 5 % cream Apply to affected area once, leave 8 hours then shower Repeat x 1 treatment in one week. 01/10/14   Audelia Hives Kharlie Bring, PA  tadalafil (CIALIS) 5 MG tablet Take 5 mg by mouth daily.    Historical Provider, MD   BP 135/87 mmHg  Pulse 76  Temp(Src) 98.1 F (36.7 C) (Oral)  Resp 12  SpO2 100% Physical Exam  Constitutional: He is oriented  to person, place, and time. He appears well-developed and well-nourished. No distress.  HENT:  Head: Normocephalic and atraumatic.  Eyes: Conjunctivae are normal. No scleral icterus.  Cardiovascular: Normal rate.   Pulmonary/Chest: Effort normal.  Genitourinary:  Patient declined exam  Musculoskeletal: Normal range of motion.  Neurological: He is alert and oriented to person, place, and time.  Skin: Skin is warm and dry. Rash noted.  Papular rash on hands, upper extremities, anterior torso, waistline and groin  Psychiatric: He has a normal mood and affect. His behavior is normal.  Nursing note and vitals reviewed.   ED Course  Procedures (including critical care time) Labs Review Labs Reviewed  RPR  HIV ANTIBODY (ROUTINE TESTING)  URINE CYTOLOGY ANCILLARY ONLY    Imaging Review No results found.   MDM   1. Scabies   2. Urethritis   treated empirically at [facility] for gonorrhea and chlamydia with ceftriaxone 250mg  IM and azithromycin 1000mg  po. Will notify if lab results indicate the need for additional treatment. RPR and HIV pend Elimite for scabies Questions answered and printed instructions given to patient.     Lutricia Feil, [place] 01/10/14 1136

## 2014-01-10 NOTE — Discharge Instructions (Signed)
You have been treated for gonorrhea and chlamydia here today. If your lab results indicate the need for additional treatment, you will be notified by phone. You do not need to get rid of your cat. Pets cannot carry human scabies. No sex for 2 weeks and only when symptoms have resolved. Wear a condom Notify partner so they may be tested and treated.  Scabies Scabies are small bugs (mites) that burrow under the skin and cause red bumps and severe itching. These bugs can only be seen with a microscope. Scabies are highly contagious. They can spread easily from person to person by direct contact. They are also spread through sharing clothing or linens that have the scabies mites living in them. It is not unusual for an entire family to become infected through shared towels, clothing, or bedding.  HOME CARE INSTRUCTIONS   Your caregiver may prescribe a cream or lotion to kill the mites. If cream is prescribed, massage the cream into the entire body from the neck to the bottom of both feet. Also massage the cream into the scalp and face if your child is less than 49 year old. Avoid the eyes and mouth. Do not wash your hands after application.  Leave the cream on for 8 to 12 hours. Your child should bathe or shower after the 8 to 12 hour application period. Sometimes it is helpful to apply the cream to your child right before bedtime.  One treatment is usually effective and will eliminate approximately 95% of infestations. For severe cases, your caregiver may decide to repeat the treatment in 1 week. Everyone in your household should be treated with one application of the cream.  New rashes or burrows should not appear within 24 to 48 hours after successful treatment. However, the itching and rash may last for 2 to 4 weeks after successful treatment. Your caregiver may prescribe a medicine to help with the itching or to help the rash go away more quickly.  Scabies can live on clothing or linens for up to 3  days. All of your child's recently used clothing, towels, stuffed toys, and bed linens should be washed in hot water and then dried in a dryer for at least 20 minutes on high heat. Items that cannot be washed should be enclosed in a plastic bag for at least 3 days.  To help relieve itching, bathe your child in a cool bath or apply cool washcloths to the affected areas.  Your child may return to school after treatment with the prescribed cream. SEEK MEDICAL CARE IF:   The itching persists longer than 4 weeks after treatment.  The rash spreads or becomes infected. Signs of infection include red blisters or yellow-tan crust. Document Released: 02/17/2005 Document Revised: 05/12/2011 Document Reviewed: 06/28/2008 [facility] Patient Information 2015 [place], [place]. This information is not intended to replace advice given to you by your health care provider. Make sure you discuss any questions you have with your health care provider.  Urethritis Urethritis is an inflammation of the tube through which urine exits your bladder (urethra).  CAUSES Urethritis is often caused by an infection in your urethra. The infection can be viral, like herpes. The infection can also be bacterial, like gonorrhea. RISK FACTORS Risk factors of urethritis include:  Having sex without using a condom.  Having multiple sexual partners.  Having poor hygiene. SIGNS AND SYMPTOMS Symptoms of urethritis are less noticeable in women than in men. These symptoms include:  Burning feeling when you urinate (dysuria).  Discharge from your urethra.  Blood in your urine (hematuria).  Urinating more than usual. DIAGNOSIS  To confirm a diagnosis of urethritis, your health care provider will do the following:  Ask about your sexual history.  Perform a physical exam.  Have you provide a sample of your urine for lab testing.  Use a cotton swab to gently collect a sample from your urethra for lab testing. TREATMENT  It  is important to treat urethritis. Depending on the cause, untreated urethritis may lead to serious genital infections and possibly infertility. Urethritis caused by a bacterial infection is treated with antibiotic medicine. All sexual partners must be treated.  HOME CARE INSTRUCTIONS  Do not have sex until the test results are known and treatment is completed, even if your symptoms go away before you finish treatment.  If you were prescribed an antibiotic, finish it all even if you start to feel better. SEEK MEDICAL CARE IF:   Your symptoms are not improved in 3 days.  Your symptoms are getting worse.  You develop abdominal pain or pelvic pain (in women).  You develop joint pain.  You have a fever. SEEK IMMEDIATE MEDICAL CARE IF:   You have severe pain in the belly, back, or side.  You have repeated vomiting. MAKE SURE YOU:  Understand these instructions.  Will watch your condition.  Will get help right away if you are not doing well or get worse. Document Released: 08/13/2000 Document Revised: 07/04/2013 Document Reviewed: 10/18/2012 [facility] Patient Information 2015 [place], [place]. This information is not intended to replace advice given to you by your health care provider. Make sure you discuss any questions you have with your health care provider.

## 2014-01-10 NOTE — ED Notes (Signed)
Pt is here today because he has had some discharge from his penis since yesterday, he has also been itching and had a rash on his torso and legs, pt also complains of some irritation near a mole that he has on his left side

## 2014-01-11 LAB — URINE CYTOLOGY ANCILLARY ONLY
CHLAMYDIA, DNA PROBE: NEGATIVE
NEISSERIA GONORRHEA: NEGATIVE
Trichomonas: NEGATIVE

## 2014-01-25 ENCOUNTER — Encounter (HOSPITAL_COMMUNITY): Payer: Self-pay | Admitting: Emergency Medicine

## 2014-01-25 ENCOUNTER — Emergency Department (INDEPENDENT_AMBULATORY_CARE_PROVIDER_SITE_OTHER)
Admission: EM | Admit: 2014-01-25 | Discharge: 2014-01-25 | Disposition: A | Discharge status: Home / Self Care | Payer: Medicare PPO | Source: Home / Self Care | Attending: Emergency Medicine | Admitting: Emergency Medicine

## 2014-01-25 DIAGNOSIS — B86 Scabies: Secondary | ICD-10-CM

## 2014-01-25 MED ORDER — PERMETHRIN 5 % EX CREA
TOPICAL_CREAM | CUTANEOUS | Status: DC
Start: 2014-01-25 — End: 2015-08-07

## 2014-01-25 MED ORDER — HYDROXYZINE HCL 25 MG PO TABS: 25.0000 mg | ORAL_TABLET | ORAL | Status: DC | PRN

## 2014-01-25 MED ORDER — IVERMECTIN 3 MG PO TABS: ORAL_TABLET | ORAL | Status: DC

## 2014-01-25 NOTE — ED Notes (Signed)
48 year old male here today with worsening of rash, itching so much he states that he is unable to sleep.  States he has washed all his clothes, vacuumed everything including his matress, been showering frequently, using antibacterial body wash ect.

## 2014-01-25 NOTE — ED Provider Notes (Signed)
CSN: [id number]     Arrival date & time 01/25/14  1112 History   First MD Initiated Contact with Patient 01/25/14 1201     Chief Complaint  Patient presents with  . Rash   (Consider location/radiation/quality/duration/timing/severity/associated sxs/prior Treatment) HPI He is a 48 year old man here for follow-up of itching. He was seen here last week and diagnosed with scabies. He states he applied the permethrin cream, left on for 8 hours, then washed it off. He has also washed all of his close and linens and dried on high heat. He states he did have some temporary relief of the itching after the permethrin cream, but within 24 hours the itching had returned. He has been taking for showers a day. He has been applying the permethrin cream on the itchy spots the last few days. He is itching enough where he cannot sleep at night.  Past Medical History  Diagnosis Date  . Pancreatic cancer    History reviewed. No pertinent past surgical history. History reviewed. No pertinent family history. History  Substance Use Topics  . Smoking status: Current Every Day Smoker -- 0.10 packs/day    Types: Cigarettes  . Smokeless tobacco: Not on file  . Alcohol Use: Yes    Review of Systems  Skin: Positive for rash.    Allergies  Morphine and related  Home Medications   Prior to Admission medications   Medication Sig Start Date End Date Taking? Authorizing Provider  amoxicillin (AMOXIL) 500 MG capsule Take 1 capsule (500 mg total) by mouth 3 (three) times daily. 10/27/13   Harden Mo, MD  hydrOXYzine (ATARAX/VISTARIL) 25 MG tablet Take 1 tablet (25 mg total) by mouth every 4 (four) hours as needed for itching. 01/25/14   Melony Overly, MD  ivermectin (STROMECTOL) 3 MG TABS tablet Take 6 tablets by mouth today.  Take another 6 tablets in 2 weeks. 01/25/14   Melony Overly, MD  permethrin (ELIMITE) 5 % cream Apply from neck down, leave 12 hours then shower Repeat x 1 treatment in one week.  01/25/14   Melony Overly, MD  tadalafil (CIALIS) 5 MG tablet Take 5 mg by mouth daily.    Historical Provider, MD   BP 132/76 mmHg  Pulse 78  Temp(Src) 99 F (37.2 C) (Oral)  Resp 20  SpO2 100% Physical Exam  Constitutional: He is oriented to person, place, and time. He appears well-developed and well-nourished. He appears distressed.  Cardiovascular: Normal rate.   Pulmonary/Chest: Effort normal.  Neurological: He is alert and oriented to person, place, and time.  Skin:  Diffuse papular rash with overlying excoriations    ED Course  Procedures (including critical care time) Labs Review Labs Reviewed - No data to display  Imaging Review No results found.   MDM   1. Scabies    History and exam is consistent with scabies. We'll treat with a second dose of permethrin cream. We'll also treat with ivermectin oral, 200 mcg/kg. Hydroxyzine provided to use every 4 hours for itching. Recommended applying over-the-counter hydrocortisone cream to acutely itchy spots. Also recommended only showering once a day and applying ice to help with itching. Follow-up as needed.    Melony Overly, MD 01/25/14 1311

## 2014-01-25 NOTE — Discharge Instructions (Signed)
You have scabies. Apply the permetrin cream tonight.  Sleep in your bed, then wash it off in the morning and wash your sheets. Take Ivermectin 6 tablets today.  Repeat this dose in 2 weeks. Take hydroxyzine 1 pill every 4 hours as needed for itching. Use over the counter hydrocortisone cream as needed on itching spots. You can also apply ice to itchy areas. Do not shower more than once a day.

## 2014-03-13 ENCOUNTER — Encounter (HOSPITAL_COMMUNITY): Payer: Self-pay | Admitting: Emergency Medicine

## 2014-03-13 ENCOUNTER — Emergency Department (HOSPITAL_COMMUNITY)
Admission: EM | Admit: 2014-03-13 | Discharge: 2014-03-13 | Disposition: A | Discharge status: Home / Self Care | Payer: Medicare PPO | Attending: Emergency Medicine | Admitting: Emergency Medicine

## 2014-03-13 DIAGNOSIS — Z79899 Other long term (current) drug therapy: Secondary | ICD-10-CM | POA: Diagnosis not present

## 2014-03-13 DIAGNOSIS — N342 Other urethritis: Secondary | ICD-10-CM | POA: Diagnosis not present

## 2014-03-13 DIAGNOSIS — R369 Urethral discharge, unspecified: Secondary | ICD-10-CM | POA: Diagnosis present

## 2014-03-13 DIAGNOSIS — Z792 Long term (current) use of antibiotics: Secondary | ICD-10-CM | POA: Diagnosis not present

## 2014-03-13 DIAGNOSIS — Z720 Tobacco use: Secondary | ICD-10-CM | POA: Insufficient documentation

## 2014-03-13 DIAGNOSIS — Z8507 Personal history of malignant neoplasm of pancreas: Secondary | ICD-10-CM | POA: Diagnosis not present

## 2014-03-13 MED ORDER — CEFTRIAXONE SODIUM 250 MG IJ SOLR
250.0000 mg | Freq: Once | INTRAMUSCULAR | Status: AC
  Administered 2014-03-13: 250 mg via INTRAMUSCULAR
  Filled 2014-03-13: qty 250

## 2014-03-13 MED ORDER — AZITHROMYCIN 250 MG PO TABS
1000.0000 mg | ORAL_TABLET | Freq: Once | ORAL | Status: AC
  Administered 2014-03-13: 1000 mg via ORAL
  Filled 2014-03-13: qty 4

## 2014-03-13 NOTE — Discharge Instructions (Signed)
Urethritis °Urethritis is an inflammation of the tube through which urine exits your bladder (urethra).  °CAUSES °Urethritis is often caused by an infection in your urethra. The infection can be viral, like herpes. The infection can also be bacterial, like gonorrhea. °RISK FACTORS °Risk factors of urethritis include: °· Having sex without using a condom. °· Having multiple sexual partners. °· Having poor hygiene. °SIGNS AND SYMPTOMS °Symptoms of urethritis are less noticeable in women than in men. These symptoms include: °· Burning feeling when you urinate (dysuria). °· Discharge from your urethra. °· Blood in your urine (hematuria). °· Urinating more than usual. °DIAGNOSIS  °To confirm a diagnosis of urethritis, your health care provider will do the following: °· Ask about your sexual history. °· Perform a physical exam. °· Have you provide a sample of your urine for lab testing. °· Use a cotton swab to gently collect a sample from your urethra for lab testing. °TREATMENT  °It is important to treat urethritis. Depending on the cause, untreated urethritis may lead to serious genital infections and possibly infertility. Urethritis caused by a bacterial infection is treated with antibiotic medicine. All sexual partners must be treated.  °HOME CARE INSTRUCTIONS °· Do not have sex until the test results are known and treatment is completed, even if your symptoms go away before you finish treatment. °· If you were prescribed an antibiotic, finish it all even if you start to feel better. °SEEK MEDICAL CARE IF:  °· Your symptoms are not improved in 3 days. °· Your symptoms are getting worse. °· You develop abdominal pain or pelvic pain (in women). °· You develop joint pain. °· You have a fever. °SEEK IMMEDIATE MEDICAL CARE IF:  °· You have severe pain in the belly, back, or side. °· You have repeated vomiting. °MAKE SURE YOU: °· Understand these instructions. °· Will watch your condition. °· Will get help right away if you  are not doing well or get worse. °Document Released: 08/13/2000 Document Revised: 07/04/2013 Document Reviewed: 10/18/2012 °[employer]® Patient Information ©2015 [employer]. This information is not intended to replace advice given to you by your health care provider. Make sure you discuss any questions you have with your health care provider. ° °

## 2014-03-13 NOTE — ED Notes (Signed)
Pt placed in a gown and hooked up to monitor with 5 lead, BP cuff and pulse ox

## 2014-03-13 NOTE — ED Notes (Signed)
Pt c/o discharge from penis x 1 day/burning with urination.

## 2014-03-13 NOTE — ED Provider Notes (Signed)
CSN: [id number]     Arrival date & time 03/13/14  0550 History   First MD Initiated Contact with Patient 03/13/14 0554     Chief Complaint  Patient presents with  . Penile Discharge     HPI Patient presents complaining of penile discharge that he noted today.  He reports new unprotected sex with a new partner over the past several days.  He's had no STD before.  He is concerned that he has a recurrent STD this time given the white/green discharge coming out of his penis this morning.  No fevers or chills.  No abdominal pain.  Symptoms are mild.  Past Medical History  Diagnosis Date  . Pancreatic cancer    History reviewed. No pertinent past surgical history. History reviewed. No pertinent family history. History  Substance Use Topics  . Smoking status: Current Every Day Smoker -- 0.10 packs/day    Types: Cigarettes  . Smokeless tobacco: Not on file  . Alcohol Use: Yes    Review of Systems  All other systems reviewed and are negative.     Allergies  Morphine and related  Home Medications   Prior to Admission medications   Medication Sig Start Date End Date Taking? Authorizing Provider  amoxicillin (AMOXIL) 500 MG capsule Take 1 capsule (500 mg total) by mouth 3 (three) times daily. 10/27/13   Harden Mo, MD  hydrOXYzine (ATARAX/VISTARIL) 25 MG tablet Take 1 tablet (25 mg total) by mouth every 4 (four) hours as needed for itching. 01/25/14   Melony Overly, MD  ivermectin (STROMECTOL) 3 MG TABS tablet Take 6 tablets by mouth today.  Take another 6 tablets in 2 weeks. 01/25/14   Melony Overly, MD  permethrin (ELIMITE) 5 % cream Apply from neck down, leave 12 hours then shower Repeat x 1 treatment in one week. 01/25/14   Melony Overly, MD  tadalafil (CIALIS) 5 MG tablet Take 5 mg by mouth daily.    Historical Provider, MD   BP 147/105 mmHg  Pulse 76  Temp(Src) 97.9 F (36.6 C) (Oral)  Resp 19  SpO2 100% Physical Exam  Constitutional: He is oriented to person, place, and  time. He appears well-developed and well-nourished.  HENT:  Head: Normocephalic.  Eyes: EOM are normal.  Neck: Normal range of motion.  Pulmonary/Chest: Effort normal.  Abdominal: He exhibits no distension.  Genitourinary:  Circumcised penis.  Small amount of discharge coming from the urethral meatus  Musculoskeletal: Normal range of motion.  Neurological: He is alert and oriented to person, place, and time.  Psychiatric: He has a normal mood and affect.  Nursing note and vitals reviewed.   ED Course  Procedures (including critical care time) Labs Review Labs Reviewed - No data to display  Imaging Review No results found.   EKG Interpretation None      MDM   Final diagnoses:  Urethritis    Patient was given Rocephin and azithromycin for urethritis.   Hoy Morn, MD 03/13/14 [phone]

## 2014-03-13 NOTE — ED Notes (Signed)
Pt a/o x 4 on d/c with steady gait. Pt refused wheelchair.

## 2014-03-21 ENCOUNTER — Emergency Department (HOSPITAL_COMMUNITY)
Admission: EM | Admit: 2014-03-21 | Discharge: 2014-03-21 | Discharge status: Against Medical Advice | Payer: Medicare PPO

## 2014-03-21 NOTE — ED Notes (Addendum)
Pt called x 3 , no answer

## 2014-03-21 NOTE — ED Notes (Signed)
Pt called x1. No answer. 

## 2014-03-21 NOTE — ED Notes (Signed)
Pt called 1x. Pt didn't answer.

## 2014-04-09 ENCOUNTER — Emergency Department (HOSPITAL_COMMUNITY): Payer: Medicare HMO

## 2014-04-09 ENCOUNTER — Encounter (HOSPITAL_COMMUNITY): Payer: Self-pay | Admitting: *Deleted

## 2014-04-09 ENCOUNTER — Emergency Department (HOSPITAL_COMMUNITY)
Admission: EM | Admit: 2014-04-09 | Discharge: 2014-04-09 | Disposition: A | Discharge status: Home / Self Care | Payer: Medicare HMO | Attending: Emergency Medicine | Admitting: Emergency Medicine

## 2014-04-09 DIAGNOSIS — J209 Acute bronchitis, unspecified: Secondary | ICD-10-CM | POA: Diagnosis not present

## 2014-04-09 DIAGNOSIS — Z8719 Personal history of other diseases of the digestive system: Secondary | ICD-10-CM | POA: Insufficient documentation

## 2014-04-09 DIAGNOSIS — Z79899 Other long term (current) drug therapy: Secondary | ICD-10-CM | POA: Diagnosis not present

## 2014-04-09 DIAGNOSIS — Z8507 Personal history of malignant neoplasm of pancreas: Secondary | ICD-10-CM | POA: Diagnosis not present

## 2014-04-09 DIAGNOSIS — R05 Cough: Secondary | ICD-10-CM | POA: Diagnosis present

## 2014-04-09 DIAGNOSIS — Z720 Tobacco use: Secondary | ICD-10-CM | POA: Insufficient documentation

## 2014-04-09 DIAGNOSIS — R369 Urethral discharge, unspecified: Secondary | ICD-10-CM

## 2014-04-09 DIAGNOSIS — R360 Urethral discharge without blood: Secondary | ICD-10-CM | POA: Insufficient documentation

## 2014-04-09 LAB — URINALYSIS, ROUTINE W REFLEX MICROSCOPIC
Bilirubin Urine: NEGATIVE
GLUCOSE, UA: NEGATIVE mg/dL
Hgb urine dipstick: NEGATIVE
KETONES UR: NEGATIVE mg/dL
Leukocytes, UA: NEGATIVE
NITRITE: NEGATIVE
PROTEIN: NEGATIVE mg/dL
SPECIFIC GRAVITY, URINE: 1.002 — AB (ref 1.005–1.030)
Urobilinogen, UA: 0.2 mg/dL (ref 0.0–1.0)
pH: 8.5 — ABNORMAL HIGH (ref 5.0–8.0)

## 2014-04-09 MED ORDER — AZITHROMYCIN 1 G PO PACK
1.0000 g | PACK | Freq: Once | ORAL | Status: AC
  Administered 2014-04-09: 1 g via ORAL
  Filled 2014-04-09: qty 1

## 2014-04-09 MED ORDER — LIDOCAINE HCL (PF) 1 % IJ SOLN
INTRAMUSCULAR | Status: AC
Start: 2014-04-09 — End: 2014-04-09
  Administered 2014-04-09: 0.9 mL
  Filled 2014-04-09: qty 5

## 2014-04-09 MED ORDER — CEFTRIAXONE SODIUM 250 MG IJ SOLR
250.0000 mg | Freq: Once | INTRAMUSCULAR | Status: AC
  Administered 2014-04-09: 250 mg via INTRAMUSCULAR
  Filled 2014-04-09: qty 250

## 2014-04-09 NOTE — Discharge Instructions (Signed)

## 2014-04-09 NOTE — ED Notes (Signed)
The pt has had a cough for 2 days he also saw something when he used the br this am  And he thinks he may have caught something from his girlfriend.  Dry cough

## 2014-04-09 NOTE — ED Provider Notes (Signed)
CSN: [id number]     Arrival date & time 04/09/14  1551 History   First MD Initiated Contact with Patient 04/09/14 1606     Chief Complaint  Patient presents with  . multiple complaints      The history is provided by the patient. No language interpreter was used.   Fernando Diaz presents for evaluation of 2 complaints. His first complaint is cough. He reports 2 days of cough productive of yellow and green mucus. He feels slightly short of breath from this. He denies any chest pain, leg pain or swelling, fevers. Symptoms are moderate and constant. He also has a complaint of penile discharge. He reports this looks like mucus and he is concerned that his girlfriend gave him an infection. He denies any dysuria, hematuria, foul smelling urine. Review of systems is positive for chronic epigastric abdominal pain, this has been present for several years and is unchanged from baseline.  Past Medical History  Diagnosis Date  . Pancreatic cancer   . Bleeding ulcer    Past Surgical History  Procedure Laterality Date  . Tonsillectomy    . Back surgery     No family history on file. History  Substance Use Topics  . Smoking status: Current Every Day Smoker -- 1.00 packs/day    Types: Cigarettes  . Smokeless tobacco: Not on file  . Alcohol Use: Yes     Comment: last used 40 days ago    Review of Systems  All other systems reviewed and are negative.     Allergies  Review of patient's allergies indicates no known allergies.  Home Medications   Prior to Admission medications   Medication Sig Start Date End Date Taking? Authorizing Provider  pantoprazole (PROTONIX) 20 MG tablet Take 1 tablet (20 mg total) by mouth daily. 05/04/13   Noland Fordyce, PA-C  promethazine (PHENERGAN) 25 MG tablet Take 1 tablet (25 mg total) by mouth every 6 (six) hours as needed for nausea or vomiting. 05/04/13   Noland Fordyce, PA-C   BP 161/94 mmHg  Pulse 96  Resp 21  Ht 6\' 1"  (1.854 m)  Wt 207 lb (93.895 kg)  BMI  27.32 kg/m2  SpO2 97% Physical Exam  Constitutional: He is oriented to person, place, and time. He appears well-developed and well-nourished.  HENT:  Head: Normocephalic and atraumatic.  Mouth/Throat: Oropharynx is clear and moist.  Neck: Neck supple.  Cardiovascular: Normal rate and regular rhythm.   No murmur heard. Pulmonary/Chest: Effort normal and breath sounds normal. No respiratory distress.  Abdominal: Soft. There is no tenderness. There is no rebound and no guarding.  Genitourinary: Penis normal. No penile tenderness.  Musculoskeletal: He exhibits no edema or tenderness.  Neurological: He is alert and oriented to person, place, and time.  Skin: Skin is warm and dry.  Psychiatric: He has a normal mood and affect. His behavior is normal.  Nursing note and vitals reviewed.   ED Course  Procedures (including critical care time) Labs Review Labs Reviewed  URINALYSIS, ROUTINE W REFLEX MICROSCOPIC - Abnormal; Notable for the following:    Specific Gravity, Urine 1.002 (*)    pH 8.5 (*)    All other components within normal limits  RPR  HIV ANTIBODY (ROUTINE TESTING)  GC/CHLAMYDIA PROBE AMP ([place])    Imaging Review Dg Chest 2 View  04/09/2014   CLINICAL DATA:  Nonproductive cough for 2 days.  No fever.  EXAM: CHEST  2 VIEW  COMPARISON:  10/10/2007  FINDINGS: The  heart size and mediastinal contours are within normal limits. Both lungs are clear. The visualized skeletal structures are unremarkable.  IMPRESSION: No active cardiopulmonary disease.   Electronically Signed   By: Lucienne Capers M.D.   On: 04/09/2014 17:35     EKG Interpretation   Date/Time:  Sunday April 09 2014 16:42:34 EST Ventricular Rate:  82 PR Interval:  127 QRS Duration: 85 QT Interval:  354 QTC Calculation: 413 R Axis:   9 Text Interpretation:  Sinus rhythm Probable left atrial enlargement  Confirmed by Hazle Coca [phone]) on 04/09/2014 5:10:37 PM      MDM   Final diagnoses:  Acute  bronchitis, unspecified organism  Penile discharge    History of presents for evaluation of cough and penile discharge. In terms of cough clinical picture is not consistent with ACS, CHF, PE, pneumonia. Patient with bronchitis, no evidence of acute bacterial infection, no respiratory distress on exam. Discussed with patient home care for bronchitis, recommend over-the-counter decongestants and PCP follow-up. In terms of penile discharge, treating for urethritis with Rocephin and azithromycin, cultures pending.  Quintella Reichert, MD 04/09/14 1758

## 2014-04-10 LAB — GC/CHLAMYDIA PROBE AMP (~~LOC~~) NOT AT ARMC
CHLAMYDIA, DNA PROBE: NEGATIVE
Neisseria Gonorrhea: NEGATIVE

## 2014-04-10 LAB — HIV ANTIBODY (ROUTINE TESTING W REFLEX): HIV Screen 4th Generation wRfx: NONREACTIVE

## 2014-04-10 LAB — RPR: RPR: NONREACTIVE

## 2014-04-21 ENCOUNTER — Encounter (HOSPITAL_COMMUNITY): Payer: Self-pay | Admitting: Nurse Practitioner

## 2014-04-21 ENCOUNTER — Emergency Department (HOSPITAL_COMMUNITY): Payer: Medicare HMO

## 2014-04-21 ENCOUNTER — Emergency Department (HOSPITAL_COMMUNITY)
Admission: EM | Admit: 2014-04-21 | Discharge: 2014-04-22 | Disposition: A | Discharge status: Home / Self Care | Payer: Medicare HMO | Attending: Emergency Medicine | Admitting: Emergency Medicine

## 2014-04-21 DIAGNOSIS — Z8507 Personal history of malignant neoplasm of pancreas: Secondary | ICD-10-CM | POA: Insufficient documentation

## 2014-04-21 DIAGNOSIS — Y9289 Other specified places as the place of occurrence of the external cause: Secondary | ICD-10-CM | POA: Diagnosis not present

## 2014-04-21 DIAGNOSIS — S3992XA Unspecified injury of lower back, initial encounter: Secondary | ICD-10-CM | POA: Diagnosis present

## 2014-04-21 DIAGNOSIS — S20229A Contusion of unspecified back wall of thorax, initial encounter: Secondary | ICD-10-CM | POA: Insufficient documentation

## 2014-04-21 DIAGNOSIS — S79911A Unspecified injury of right hip, initial encounter: Secondary | ICD-10-CM | POA: Insufficient documentation

## 2014-04-21 DIAGNOSIS — S199XXA Unspecified injury of neck, initial encounter: Secondary | ICD-10-CM | POA: Insufficient documentation

## 2014-04-21 DIAGNOSIS — S59912A Unspecified injury of left forearm, initial encounter: Secondary | ICD-10-CM | POA: Insufficient documentation

## 2014-04-21 DIAGNOSIS — Y9389 Activity, other specified: Secondary | ICD-10-CM | POA: Diagnosis not present

## 2014-04-21 DIAGNOSIS — M79651 Pain in right thigh: Secondary | ICD-10-CM | POA: Diagnosis not present

## 2014-04-21 DIAGNOSIS — S2020XA Contusion of thorax, unspecified, initial encounter: Secondary | ICD-10-CM

## 2014-04-21 DIAGNOSIS — Z8719 Personal history of other diseases of the digestive system: Secondary | ICD-10-CM | POA: Diagnosis not present

## 2014-04-21 DIAGNOSIS — R51 Headache: Secondary | ICD-10-CM | POA: Diagnosis not present

## 2014-04-21 DIAGNOSIS — S4992XA Unspecified injury of left shoulder and upper arm, initial encounter: Secondary | ICD-10-CM | POA: Insufficient documentation

## 2014-04-21 DIAGNOSIS — Y998 Other external cause status: Secondary | ICD-10-CM | POA: Insufficient documentation

## 2014-04-21 DIAGNOSIS — Z720 Tobacco use: Secondary | ICD-10-CM | POA: Insufficient documentation

## 2014-04-21 DIAGNOSIS — W19XXXA Unspecified fall, initial encounter: Secondary | ICD-10-CM

## 2014-04-21 DIAGNOSIS — W1839XA Other fall on same level, initial encounter: Secondary | ICD-10-CM | POA: Diagnosis not present

## 2014-04-21 DIAGNOSIS — Z79899 Other long term (current) drug therapy: Secondary | ICD-10-CM | POA: Diagnosis not present

## 2014-04-21 MED ORDER — FENTANYL CITRATE 0.05 MG/ML IJ SOLN
50.0000 ug | Freq: Once | INTRAMUSCULAR | Status: AC
  Administered 2014-04-21: 50 ug via INTRAVENOUS
  Filled 2014-04-21: qty 2

## 2014-04-21 NOTE — ED Notes (Signed)
Patient transported to CT 

## 2014-04-21 NOTE — ED Notes (Signed)
Bed: XG33 Expected date:  Expected time:  Means of arrival:  Comments: EMS Fall Arm Pain IV

## 2014-04-21 NOTE — ED Provider Notes (Signed)
CSN: [id number]     Arrival date & time 04/21/14  2230 History   First MD Initiated Contact with Patient 04/21/14 2235     Chief Complaint  Patient presents with  . Fall  . Arm Pain    Left arm humerous     (Consider location/radiation/quality/duration/timing/severity/associated sxs/prior Treatment) HPI Comments: Threw into a dumpster which was full.  He then climbed up onto the edge of the dumpster to make sure that the bag went in and fell off landing on his left side.  He states that he is not sure if he hit his head.  It is not clear of events of fall.  He is complaining of occipital headache, neck pain at the base of the skull, left shoulder and clavicle pain, left humerus and forearm pain, right hip and femur pain, thoracic and lumbar back pain.  He has a history of prior cervical and lumbar fusions  Patient is a 49 y.o. male presenting with fall and arm pain.  Fall Pertinent negatives include no chest pain, no abdominal pain, no headaches and no shortness of breath.  Arm Pain Pertinent negatives include no chest pain, no abdominal pain, no headaches and no shortness of breath.    Past Medical History  Diagnosis Date  . Pancreatic cancer   . Bleeding ulcer    Past Surgical History  Procedure Laterality Date  . Tonsillectomy    . Back surgery     History reviewed. No pertinent family history. History  Substance Use Topics  . Smoking status: Current Every Day Smoker -- 1.00 packs/day    Types: Cigarettes  . Smokeless tobacco: Not on file  . Alcohol Use: Yes     Comment: last used 40 days ago    Review of Systems  Constitutional: Negative for fever, activity change, appetite change and fatigue.  HENT: Negative for congestion, facial swelling, rhinorrhea and trouble swallowing.   Eyes: Negative for photophobia and pain.  Respiratory: Negative for cough, chest tightness and shortness of breath.   Cardiovascular: Negative for chest pain and leg swelling.    Gastrointestinal: Negative for nausea, vomiting, abdominal pain, diarrhea and constipation.  Endocrine: Negative for polydipsia and polyuria.  Genitourinary: Negative for dysuria, urgency, decreased urine volume and difficulty urinating.  Musculoskeletal: Negative for back pain and gait problem.  Skin: Negative for color change, rash and wound.  Allergic/Immunologic: Negative for immunocompromised state.  Neurological: Negative for dizziness, facial asymmetry, speech difficulty, weakness, numbness and headaches.  Psychiatric/Behavioral: Negative for confusion, decreased concentration and agitation.      Allergies  Morphine and related  Home Medications   Prior to Admission medications   Medication Sig Start Date End Date Taking? Authorizing Provider  omeprazole (PRILOSEC) 20 MG capsule Take 20 mg by mouth daily.   Yes Historical Provider, MD  naproxen (NAPROSYN) 500 MG tablet Take 1 tablet (500 mg total) by mouth 2 (two) times daily with a meal. 04/22/14   Ernestina Patches, MD  pantoprazole (PROTONIX) 20 MG tablet Take 1 tablet (20 mg total) by mouth daily. Patient not taking: Reported on 04/21/2014 05/04/13   Noland Fordyce, PA-C  promethazine (PHENERGAN) 25 MG tablet Take 1 tablet (25 mg total) by mouth every 6 (six) hours as needed for nausea or vomiting. Patient not taking: Reported on 04/21/2014 05/04/13   Noland Fordyce, PA-C   BP 151/94 mmHg  Pulse 79  Temp(Src) 97.9 F (36.6 C)  Resp 18  SpO2 98% Physical Exam  Constitutional: He is oriented to  person, place, and time. He appears well-developed and well-nourished. No distress.  HENT:  Head: Normocephalic and atraumatic.    Mouth/Throat: No oropharyngeal exudate.  Eyes: Pupils are equal, round, and reactive to light.  Neck: Normal range of motion. Neck supple.    Cardiovascular: Normal rate, regular rhythm and normal heart sounds.  Exam reveals no gallop and no friction rub.   No murmur heard. Pulmonary/Chest: Effort normal  and breath sounds normal. No respiratory distress. He has no wheezes. He has no rales.    Abdominal: Soft. Bowel sounds are normal. He exhibits no distension and no mass. There is no tenderness. There is no rebound and no guarding.  Musculoskeletal: Normal range of motion. He exhibits no edema or tenderness.       Arms:      Legs: Neurological: He is alert and oriented to person, place, and time. He has normal strength. He displays no tremor. No cranial nerve deficit or sensory deficit. He exhibits normal muscle tone. He displays a negative Romberg sign. Coordination normal. GCS eye subscore is 4. GCS verbal subscore is 5. GCS motor subscore is 6.  Skin: Skin is warm and dry.  Psychiatric: He has a normal mood and affect.    ED Course  Procedures (including critical care time) Labs Review Labs Reviewed - No data to display  Imaging Review Dg Thoracic Spine 2 View  04/22/2014   CLINICAL DATA:  Pt fell backward off of a dumpster, although unable to recall the events leading to the fall, he denies head impact, obvious deformity noted on assessment of left humerus, left shoulder pain, reports decreased sensation distal to injured arm. Right femur pain. Upper and lower back pain.  EXAM: THORACIC SPINE - 2 VIEW  COMPARISON:  Chest radiograph, 04/09/2014  FINDINGS: No fracture.  No spondylolisthesis.  Mild dextroscoliosis with the apex in the mid thoracic spine, stable.  Soft tissues are unremarkable.  IMPRESSION: No fracture or acute finding.   Electronically Signed   By: Lajean Manes M.D.   On: 04/22/2014 00:02   Dg Lumbar Spine Complete  04/22/2014   CLINICAL DATA:  [place] backward off a dumpster  EXAM: LUMBAR SPINE - COMPLETE 4+ VIEW  COMPARISON:  05/04/2013  FINDINGS: There is posterior decompression with instrumented fusion at L3-4. The hardware appears intact. The lumbar vertebrae are normal in height. Mild facet arthritis is present at L4-5 and L5-S1. Good preservation of the interspaces. There  is no evidence of acute lumbar spine fracture.  IMPRESSION: Negative for acute fracture.   Electronically Signed   By: Andreas Newport M.D.   On: 04/22/2014 00:01   Dg Pelvis 1-2 Views  04/21/2014   CLINICAL DATA:  [place] backward off a dumpster  EXAM: PELVIS - 1-2 VIEW  COMPARISON:  None.  FINDINGS: There is no evidence of pelvic fracture or diastasis. No pelvic bone lesions are seen.  IMPRESSION: Negative.   Electronically Signed   By: Andreas Newport M.D.   On: 04/21/2014 23:59   Dg Forearm Left  04/21/2014   CLINICAL DATA:  [place] backward off a dumpster  EXAM: LEFT SHOULDER - 2+ VIEW; LEFT FOREARM - 2 VIEW; LEFT HUMERUS - 2+ VIEW  COMPARISON:  None.  FINDINGS: Two views of the left shoulder are negative for acute fracture or dislocation. No bone lesion or bony destruction is evident. No acute soft tissue abnormality is evident.  Two views of the left humerus are negative for acute fracture or dislocation. No acute soft tissue  abnormalities are evident.  Two views of the left forearm are negative for fracture, dislocation or radiopaque foreign body.  IMPRESSION: Negative for acute fracture or foreign body.   Electronically Signed   By: Andreas Newport M.D.   On: 04/21/2014 23:59   Ct Head Wo Contrast  04/22/2014   CLINICAL DATA:  [place] backward  EXAM: CT HEAD WITHOUT CONTRAST  CT CERVICAL SPINE WITHOUT CONTRAST  TECHNIQUE: Multidetector CT imaging of the head and cervical spine was performed following the standard protocol without intravenous contrast. Multiplanar CT image reconstructions of the cervical spine were also generated.  COMPARISON:  12/31/2011  FINDINGS: CT HEAD FINDINGS  There is no intracranial hemorrhage, mass or evidence of acute infarction. The brain and CSF spaces appear unremarkable.  The bony structures are intact. The visible portions of the paranasal sinuses are clear.  CT CERVICAL SPINE FINDINGS  There is interbody fusion from C3 through C7 with anterior fixation hardware from C3  through C5. There is no evidence of acute fracture. No acute soft tissue abnormality is evident.  IMPRESSION: 1. No evidence of acute intracranial traumatic injury. Normal brain. 2. Negative for acute cervical spine fracture.   Electronically Signed   By: Andreas Newport M.D.   On: 04/22/2014 00:11   Ct Cervical Spine Wo Contrast  04/22/2014   CLINICAL DATA:  [place] backward  EXAM: CT HEAD WITHOUT CONTRAST  CT CERVICAL SPINE WITHOUT CONTRAST  TECHNIQUE: Multidetector CT imaging of the head and cervical spine was performed following the standard protocol without intravenous contrast. Multiplanar CT image reconstructions of the cervical spine were also generated.  COMPARISON:  12/31/2011  FINDINGS: CT HEAD FINDINGS  There is no intracranial hemorrhage, mass or evidence of acute infarction. The brain and CSF spaces appear unremarkable.  The bony structures are intact. The visible portions of the paranasal sinuses are clear.  CT CERVICAL SPINE FINDINGS  There is interbody fusion from C3 through C7 with anterior fixation hardware from C3 through C5. There is no evidence of acute fracture. No acute soft tissue abnormality is evident.  IMPRESSION: 1. No evidence of acute intracranial traumatic injury. Normal brain. 2. Negative for acute cervical spine fracture.   Electronically Signed   By: Andreas Newport M.D.   On: 04/22/2014 00:11   Dg Shoulder Left  04/21/2014   CLINICAL DATA:  [place] backward off a dumpster  EXAM: LEFT SHOULDER - 2+ VIEW; LEFT FOREARM - 2 VIEW; LEFT HUMERUS - 2+ VIEW  COMPARISON:  None.  FINDINGS: Two views of the left shoulder are negative for acute fracture or dislocation. No bone lesion or bony destruction is evident. No acute soft tissue abnormality is evident.  Two views of the left humerus are negative for acute fracture or dislocation. No acute soft tissue abnormalities are evident.  Two views of the left forearm are negative for fracture, dislocation or radiopaque foreign body.   IMPRESSION: Negative for acute fracture or foreign body.   Electronically Signed   By: Andreas Newport M.D.   On: 04/21/2014 23:59   Dg Humerus Left  04/21/2014   CLINICAL DATA:  [place] backward off a dumpster  EXAM: LEFT SHOULDER - 2+ VIEW; LEFT FOREARM - 2 VIEW; LEFT HUMERUS - 2+ VIEW  COMPARISON:  None.  FINDINGS: Two views of the left shoulder are negative for acute fracture or dislocation. No bone lesion or bony destruction is evident. No acute soft tissue abnormality is evident.  Two views of the left humerus are negative for acute  fracture or dislocation. No acute soft tissue abnormalities are evident.  Two views of the left forearm are negative for fracture, dislocation or radiopaque foreign body.  IMPRESSION: Negative for acute fracture or foreign body.   Electronically Signed   By: Andreas Newport M.D.   On: 04/21/2014 23:59   Dg Femur, Min 2 Views Right  04/22/2014   CLINICAL DATA:  [place] backward off a dumpster  EXAM: RIGHT FEMUR 2 VIEWS  COMPARISON:  None.  FINDINGS: There is no evidence of fracture or other focal bone lesions. Soft tissues are unremarkable.  IMPRESSION: Negative.   Electronically Signed   By: Andreas Newport M.D.   On: 04/22/2014 00:00     EKG Interpretation None      MDM   Final diagnoses:  Fall  Multiple contusions of trunk, initial encounter    Pt is a 49 y.o. male with Pmhx as above who presents with multiple painful areas after falling off a dumpster.  Patient states that he was taking the trash out and that the dumpster was full, so after he threw his bag of trash and he climbed up to make sure that it went in the trash and fell off onto his left side.  He is unsure if he had a loss of consciousness.  He complains of headache, neck pain at the base of the skull, left clavicle and shoulder pain, left humerus and forearm pain, left hip and femur pain as well as thoracic and lumbar pain.  He is superficial scrape to the left forearm, otherwise no signs of  external trauma.   There are no acute traumatic findings including CT head, C-spine, left shoulder, humerus and forearm T and L-spine, pelvis or right femur.  Patient will be ambulated and discharged home.  He can use naproxen for pain   [facility] evaluation in the Emergency Department is complete. It has been determined that no acute conditions requiring further emergency intervention are present at this time. The patient/guardian have been advised of the diagnosis and plan. We have discussed signs and symptoms that warrant return to the ED, such as changes or worsening in symptoms, worsening pain, fever, inability to tolerate liquids   Ernestina Patches, MD 04/22/14 0021

## 2014-04-21 NOTE — ED Notes (Signed)
Pt fell backward, although unable to recall the events leading to the fall, he denies head impact, obvious deformity noted on assessment of left humerus, reports decreased sensation distal to injured arm, cap refill <3 mins, denies any intoxication, or being on blood thinners, rates pain 8/10 post 250 mcg of Fentanyl en route from EMS.

## 2014-04-21 NOTE — ED Notes (Signed)
Patient transported to X-ray 

## 2014-04-22 DIAGNOSIS — S20229A Contusion of unspecified back wall of thorax, initial encounter: Secondary | ICD-10-CM | POA: Diagnosis not present

## 2014-04-22 MED ORDER — NAPROXEN 500 MG PO TABS: 500.0000 mg | ORAL_TABLET | Freq: Two times a day (BID) | ORAL | Status: DC

## 2014-04-22 NOTE — Discharge Instructions (Signed)
°Contusion °A contusion is a deep bruise. Contusions are the result of an injury that caused bleeding under the skin. The contusion may turn Friscia, purple, or yellow. Minor injuries will give you a painless contusion, but more severe contusions may stay painful and swollen for a few weeks.  °CAUSES  °A contusion is usually caused by a blow, trauma, or direct force to an area of the body. °SYMPTOMS  °· Swelling and redness of the injured area. °· Bruising of the injured area. °· Tenderness and soreness of the injured area. °· Pain. °DIAGNOSIS  °The diagnosis can be made by taking a history and physical exam. An X-ray, CT scan, or MRI may be needed to determine if there were any associated injuries, such as fractures. °TREATMENT  °Specific treatment will depend on what area of the body was injured. In general, the best treatment for a contusion is resting, icing, elevating, and applying cold compresses to the injured area. Over-the-counter medicines may also be recommended for pain control. Ask your caregiver what the best treatment is for your contusion. °HOME CARE INSTRUCTIONS  °· Put ice on the injured area. °· Put ice in a plastic bag. °· Place a towel between your skin and the bag. °· Leave the ice on for 15-20 minutes, 3-4 times a day, or as directed by your health care provider. °· Only take over-the-counter or prescription medicines for pain, discomfort, or fever as directed by your caregiver. Your caregiver may recommend avoiding anti-inflammatory medicines (aspirin, ibuprofen, and naproxen) for 48 hours because these medicines may increase bruising. °· Rest the injured area. °· If possible, elevate the injured area to reduce swelling. °SEEK IMMEDIATE MEDICAL CARE IF:  °· You have increased bruising or swelling. °· You have pain that is getting worse. °· Your swelling or pain is not relieved with medicines. °MAKE SURE YOU:  °· Understand these instructions. °· Will watch your condition. °· Will get help  right away if you are not doing well or get worse. °Document Released: 11/27/2004 Document Revised: 02/22/2013 Document Reviewed: 12/23/2010 °[employer]® Patient Information ©2015 [employer]. This information is not intended to replace advice given to you by your health care provider. Make sure you discuss any questions you have with your health care provider. °Fall Prevention and Home Safety °Falls cause injuries and can affect all age groups. It is possible to use preventive measures to significantly decrease the likelihood of falls. There are many simple measures which can make your home safer and prevent falls. °OUTDOORS °· Repair cracks and edges of walkways and driveways. °· Remove high doorway thresholds. °· Trim shrubbery on the main path into your home. °· Have good outside lighting. °· Clear walkways of tools, rocks, debris, and clutter. °· Check that handrails are not broken and are securely fastened. Both sides of steps should have handrails. °· Have leaves, snow, and ice cleared regularly. °· Use sand or salt on walkways during winter months. °· In the garage, clean up grease or oil spills. °BATHROOM °· Install night lights. °· Install grab bars by the toilet and in the tub and shower. °· Use non-skid mats or decals in the tub or shower. °· Place a plastic non-slip stool in the shower to sit on, if needed. °· Keep floors dry and clean up all water on the floor immediately. °· Remove soap buildup in the tub or shower on a regular basis. °· Secure bath mats with non-slip, double-sided rug tape. °· Remove throw rugs and tripping hazards   from the floors. °BEDROOMS °· Install night lights. °· Make sure a bedside light is easy to reach. °· Do not use oversized bedding. °· Keep a telephone by your bedside. °· Have a firm chair with side arms to use for getting dressed. °· Remove throw rugs and tripping hazards from the floor. °KITCHEN °· Keep handles on pots and pans turned toward the center of the stove. Use  back burners when possible. °· Clean up spills quickly and allow time for drying. °· Avoid walking on wet floors. °· Avoid hot utensils and knives. °· Position shelves so they are not too high or low. °· Place commonly used objects within easy reach. °· If necessary, use a sturdy step stool with a grab bar when reaching. °· Keep electrical cables out of the way. °· Do not use floor polish or wax that makes floors slippery. If you must use wax, use non-skid floor wax. °· Remove throw rugs and tripping hazards from the floor. °STAIRWAYS °· Never leave objects on stairs. °· Place handrails on both sides of stairways and use them. Fix any loose handrails. Make sure handrails on both sides of the stairways are as long as the stairs. °· Check carpeting to make sure it is firmly attached along stairs. Make repairs to worn or loose carpet promptly. °· Avoid placing throw rugs at the top or bottom of stairways, or properly secure the rug with carpet tape to prevent slippage. Get rid of throw rugs, if possible. °· Have an electrician put in a light switch at the top and bottom of the stairs. °OTHER FALL PREVENTION TIPS °· Wear low-heel or rubber-soled shoes that are supportive and fit well. Wear closed toe shoes. °· When using a stepladder, make sure it is fully opened and both spreaders are firmly locked. Do not climb a closed stepladder. °· Add color or contrast paint or tape to grab bars and handrails in your home. Place contrasting color strips on first and last steps. °· Learn and use mobility aids as needed. Install an electrical emergency response system. °· Turn on lights to avoid dark areas. Replace light bulbs that burn out immediately. Get light switches that glow. °· Arrange furniture to create clear pathways. Keep furniture in the same place. °· Firmly attach carpet with non-skid or double-sided tape. °· Eliminate uneven floor surfaces. °· Select a carpet pattern that does not visually hide the edge of  steps. °· Be aware of all pets. °OTHER HOME SAFETY TIPS °· Set the water temperature for 120° F (48.8° C). °· Keep emergency numbers on or near the telephone. °· Keep smoke detectors on every level of the home and near sleeping areas. °Document Released: 02/07/2002 Document Revised: 08/19/2011 Document Reviewed: 05/09/2011 °[employer]® Patient Information ©2015 [employer]. This information is not intended to replace advice given to you by your health care provider. Make sure you discuss any questions you have with your health care provider. ° °

## 2014-04-22 NOTE — ED Notes (Signed)
Pt. Ambulated down the hall and back to room without assistance. Pt. Gait steady on his feet.

## 2014-07-21 ENCOUNTER — Emergency Department (HOSPITAL_COMMUNITY): Payer: Medicare PPO

## 2014-07-21 ENCOUNTER — Encounter (HOSPITAL_COMMUNITY): Payer: Self-pay | Admitting: Emergency Medicine

## 2014-07-21 ENCOUNTER — Emergency Department (HOSPITAL_COMMUNITY)
Admission: EM | Admit: 2014-07-21 | Discharge: 2014-07-21 | Disposition: A | Discharge status: Home / Self Care | Payer: Medicare PPO | Attending: Emergency Medicine | Admitting: Emergency Medicine

## 2014-07-21 DIAGNOSIS — S199XXA Unspecified injury of neck, initial encounter: Secondary | ICD-10-CM | POA: Insufficient documentation

## 2014-07-21 DIAGNOSIS — S79912A Unspecified injury of left hip, initial encounter: Secondary | ICD-10-CM | POA: Insufficient documentation

## 2014-07-21 DIAGNOSIS — W19XXXA Unspecified fall, initial encounter: Secondary | ICD-10-CM

## 2014-07-21 DIAGNOSIS — Y998 Other external cause status: Secondary | ICD-10-CM | POA: Diagnosis not present

## 2014-07-21 DIAGNOSIS — S79911A Unspecified injury of right hip, initial encounter: Secondary | ICD-10-CM | POA: Insufficient documentation

## 2014-07-21 DIAGNOSIS — Z8507 Personal history of malignant neoplasm of pancreas: Secondary | ICD-10-CM | POA: Diagnosis not present

## 2014-07-21 DIAGNOSIS — W1839XA Other fall on same level, initial encounter: Secondary | ICD-10-CM | POA: Diagnosis not present

## 2014-07-21 DIAGNOSIS — Z720 Tobacco use: Secondary | ICD-10-CM | POA: Diagnosis not present

## 2014-07-21 DIAGNOSIS — Y92811 Bus as the place of occurrence of the external cause: Secondary | ICD-10-CM | POA: Insufficient documentation

## 2014-07-21 DIAGNOSIS — S3992XA Unspecified injury of lower back, initial encounter: Secondary | ICD-10-CM | POA: Insufficient documentation

## 2014-07-21 DIAGNOSIS — Z79899 Other long term (current) drug therapy: Secondary | ICD-10-CM | POA: Diagnosis not present

## 2014-07-21 DIAGNOSIS — Y9389 Activity, other specified: Secondary | ICD-10-CM | POA: Diagnosis not present

## 2014-07-21 MED ORDER — LORAZEPAM 2 MG/ML IJ SOLN
1.0000 mg | Freq: Once | INTRAMUSCULAR | Status: AC
  Administered 2014-07-21: 1 mg via INTRAMUSCULAR
  Filled 2014-07-21: qty 1

## 2014-07-21 MED ORDER — IBUPROFEN 800 MG PO TABS: 800.0000 mg | ORAL_TABLET | Freq: Three times a day (TID) | ORAL | Status: AC

## 2014-07-21 MED ORDER — KETOROLAC TROMETHAMINE 30 MG/ML IJ SOLN
30.0000 mg | Freq: Once | INTRAMUSCULAR | Status: AC
  Administered 2014-07-21: 30 mg via INTRAMUSCULAR
  Filled 2014-07-21: qty 1

## 2014-07-21 MED ORDER — TRAMADOL HCL 50 MG PO TABS: 50.0000 mg | ORAL_TABLET | Freq: Four times a day (QID) | ORAL | Status: DC | PRN

## 2014-07-21 NOTE — ED Notes (Signed)
Pt requesting something for pain relief.

## 2014-07-21 NOTE — ED Notes (Signed)
Pt transported to radiology.
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
## 2014-07-21 NOTE — Discharge Instructions (Signed)
As discussed, it is normal to feel worse in the days immediately following a fall regardless of medication use. ° °However, please take all medication as directed, use ice packs liberally.  If you develop any new, or concerning changes in your condition, please return here for further evaluation and management.   ° °Otherwise, please return followup with your physician ° ° ° °
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
## 2014-08-31 ENCOUNTER — Ambulatory Visit: Payer: Medicare HMO

## 2014-09-22 ENCOUNTER — Telehealth: Payer: Self-pay

## 2014-09-22 NOTE — Telephone Encounter (Signed)
Patient left message to return call for appointment.  Call return and no one answered.  Message left on machine to call the office.  Laverle Patter, RN

## 2014-09-25 NOTE — Telephone Encounter (Signed)
Called patient today.  He is able to come for office visit in August and has requested I mail appointment information to his home.   Patient contacted regarding new intake appointment. Date and time given. Information given regarding documents needed to qualify for financial eligibility.  Laverle Patter, RN

## 2014-10-04 ENCOUNTER — Ambulatory Visit: Payer: Medicare HMO

## 2014-11-16 ENCOUNTER — Ambulatory Visit: Payer: Medicare Other

## 2014-11-16 ENCOUNTER — Emergency Department (HOSPITAL_COMMUNITY)
Admission: EM | Admit: 2014-11-16 | Discharge: 2014-11-16 | Disposition: A | Discharge status: Home / Self Care | Payer: Medicare PPO

## 2014-11-16 ENCOUNTER — Other Ambulatory Visit (HOSPITAL_COMMUNITY)
Admission: RE | Admit: 2014-11-16 | Discharge: 2014-11-16 | Disposition: A | Discharge status: Home / Self Care | Payer: Medicare Other | Source: Ambulatory Visit | Attending: Infectious Disease | Admitting: Infectious Disease

## 2014-11-16 DIAGNOSIS — F329 Major depressive disorder, single episode, unspecified: Secondary | ICD-10-CM | POA: Insufficient documentation

## 2014-11-16 DIAGNOSIS — B20 Human immunodeficiency virus [HIV] disease: Secondary | ICD-10-CM

## 2014-11-16 DIAGNOSIS — E785 Hyperlipidemia, unspecified: Secondary | ICD-10-CM

## 2014-11-16 DIAGNOSIS — K2100 Gastro-esophageal reflux disease with esophagitis, without bleeding: Secondary | ICD-10-CM

## 2014-11-16 DIAGNOSIS — I10 Essential (primary) hypertension: Secondary | ICD-10-CM

## 2014-11-16 DIAGNOSIS — Z113 Encounter for screening for infections with a predominantly sexual mode of transmission: Secondary | ICD-10-CM | POA: Insufficient documentation

## 2014-11-16 DIAGNOSIS — Z21 Asymptomatic human immunodeficiency virus [HIV] infection status: Secondary | ICD-10-CM

## 2014-11-16 DIAGNOSIS — F32A Depression, unspecified: Secondary | ICD-10-CM | POA: Insufficient documentation

## 2014-11-16 DIAGNOSIS — K219 Gastro-esophageal reflux disease without esophagitis: Secondary | ICD-10-CM | POA: Insufficient documentation

## 2014-11-16 LAB — COMPLETE METABOLIC PANEL WITH GFR
ALBUMIN: 4.3 g/dL (ref 3.6–5.1)
ALK PHOS: 52 U/L (ref 40–115)
ALT: 21 U/L (ref 9–46)
AST: 31 U/L (ref 10–35)
BILIRUBIN TOTAL: 0.6 mg/dL (ref 0.2–1.2)
BUN: 9 mg/dL (ref 7–25)
CALCIUM: 9.4 mg/dL (ref 8.6–10.3)
CO2: 27 mmol/L (ref 20–31)
CREATININE: 0.98 mg/dL (ref 0.70–1.33)
Chloride: 107 mmol/L (ref 98–110)
GFR, Est African American: >89 mL/min (ref >=60)
GFR, Est Non African American: >89 mL/min (ref >=60)
GLUCOSE: 71 mg/dL (ref 65–99)
Potassium: 3.9 mmol/L (ref 3.5–5.3)
SODIUM: 142 mmol/L (ref 135–146)
TOTAL PROTEIN: 7 g/dL (ref 6.1–8.1)

## 2014-11-16 LAB — CBC WITH DIFFERENTIAL/PLATELET
BASOS PCT: 1 % (ref 0–1)
Basophils Absolute: 0.1 10*3/uL (ref 0.0–0.1)
Eosinophils Absolute: 0.2 10*3/uL (ref 0.0–0.7)
Eosinophils Relative: 4 % (ref 0–5)
HEMATOCRIT: 37.1 % — AB (ref 39.0–52.0)
HEMOGLOBIN: 12.5 g/dL — AB (ref 13.0–17.0)
LYMPHS PCT: 49 % — AB (ref 12–46)
Lymphs Abs: 2.6 10*3/uL (ref 0.7–4.0)
MCH: 28.1 pg (ref 26.0–34.0)
MCHC: 33.7 g/dL (ref 30.0–36.0)
MCV: 83.4 fL (ref 78.0–100.0)
MONO ABS: 0.3 10*3/uL (ref 0.1–1.0)
MONOS PCT: 5 % (ref 3–12)
MPV: 8.8 fL (ref 8.6–12.4)
NEUTROS ABS: 2.2 10*3/uL (ref 1.7–7.7)
NEUTROS PCT: 41 % — AB (ref 43–77)
Platelets: 220 10*3/uL (ref 150–400)
RBC: 4.45 MIL/uL (ref 4.22–5.81)
RDW: 16.1 % — ABNORMAL HIGH (ref 11.5–15.5)
WBC: 5.4 10*3/uL (ref 4.0–10.5)

## 2014-11-16 LAB — LIPID PANEL
CHOLESTEROL: 179 mg/dL (ref 125–200)
HDL: 100 mg/dL (ref >=40)
LDL CALC: 62 mg/dL (ref <130)
TRIGLYCERIDES: 86 mg/dL (ref <150)
Total CHOL/HDL Ratio: 1.8 Ratio (ref <=5.0)
VLDL: 17 mg/dL (ref <30)
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
## 2014-11-17 LAB — URINALYSIS
BILIRUBIN URINE: NEGATIVE
Glucose, UA: NEGATIVE
KETONES UR: NEGATIVE
Leukocytes, UA: NEGATIVE
NITRITE: NEGATIVE
PH: 6 (ref 5.0–8.0)
Protein, ur: NEGATIVE
Specific Gravity, Urine: 1.001 (ref 1.001–1.035)

## 2014-11-17 LAB — HEPATITIS A ANTIBODY, TOTAL: Hep A Total Ab: NONREACTIVE

## 2014-11-17 LAB — RPR

## 2014-11-17 LAB — HEPATITIS B CORE ANTIBODY, TOTAL: Hep B Core Total Ab: NONREACTIVE

## 2014-11-17 LAB — HEPATITIS B SURFACE ANTIGEN: Hepatitis B Surface Ag: NEGATIVE

## 2014-11-17 LAB — HEPATITIS B SURFACE ANTIBODY,QUALITATIVE: Hep B S Ab: NEGATIVE

## 2014-11-17 LAB — HEPATITIS C ANTIBODY: HCV Ab: NEGATIVE

## 2014-11-17 NOTE — Progress Notes (Signed)
11-17-14 In reviewing patient' medical records from [facility] and [facility] I am unable to find proof of HIV confirmation. I have documentation of request from Dr Vista Lawman on 01-19-13 of positive HIV test.  I never received confirmation.   They were also contacted in December, 2014 of need for test results.   I have checked with [facility] and they have no record of positive test results. I will await testing completed in our office at intake on 9-16, 2016 to ensure he is HIV positive.  Laverle Patter, RN

## 2014-11-18 LAB — QUANTIFERON TB GOLD ASSAY (BLOOD)
Interferon Gamma Release Assay: NEGATIVE
QUANTIFERON TB AG MINUS NIL: 0.01 [IU]/mL
Quantiferon Nil Value: 0.05 IU/mL
TB Ag value: 0.06 IU/mL

## 2014-11-20 LAB — T-HELPER CELL (CD4) - (RCID CLINIC ONLY)
CD4 % Helper T Cell: 50 % (ref 33–55)
CD4 T Cell Abs: 1270 /uL (ref 400–2700)

## 2014-11-20 LAB — URINE CYTOLOGY ANCILLARY ONLY
CHLAMYDIA, DNA PROBE: NEGATIVE
NEISSERIA GONORRHEA: NEGATIVE

## 2014-11-20 LAB — HIV-1 RNA ULTRAQUANT REFLEX TO GENTYP+: HIV-1 RNA Quant, Log: <1.3 {Log} (ref <1.30)

## 2014-11-22 LAB — HLA B*5701: HLA-B*5701 w/rflx HLA-B High: NEGATIVE

## 2014-11-27 NOTE — Progress Notes (Signed)
Patient has been notified his HIV test are negative.  He does not have HIV based on specialty testing completed at [facility] for [facility]ious [facility].  Labs verified by Dr Tommy Medal .  Laverle Patter, RN

## 2014-12-02 ENCOUNTER — Encounter (HOSPITAL_COMMUNITY): Payer: Self-pay | Admitting: Emergency Medicine

## 2014-12-02 ENCOUNTER — Emergency Department (HOSPITAL_COMMUNITY)
Admission: EM | Admit: 2014-12-02 | Discharge: 2014-12-02 | Disposition: A | Discharge status: Home / Self Care | Payer: Medicare Other | Attending: Physician Assistant | Admitting: Physician Assistant

## 2014-12-02 DIAGNOSIS — R1310 Dysphagia, unspecified: Secondary | ICD-10-CM | POA: Diagnosis not present

## 2014-12-02 DIAGNOSIS — F419 Anxiety disorder, unspecified: Secondary | ICD-10-CM | POA: Diagnosis not present

## 2014-12-02 DIAGNOSIS — Z8507 Personal history of malignant neoplasm of pancreas: Secondary | ICD-10-CM | POA: Insufficient documentation

## 2014-12-02 DIAGNOSIS — T783XXA Angioneurotic edema, initial encounter: Secondary | ICD-10-CM | POA: Insufficient documentation

## 2014-12-02 DIAGNOSIS — Y9389 Activity, other specified: Secondary | ICD-10-CM | POA: Diagnosis not present

## 2014-12-02 DIAGNOSIS — Y998 Other external cause status: Secondary | ICD-10-CM | POA: Insufficient documentation

## 2014-12-02 DIAGNOSIS — Z79899 Other long term (current) drug therapy: Secondary | ICD-10-CM | POA: Diagnosis not present

## 2014-12-02 DIAGNOSIS — X58XXXA Exposure to other specified factors, initial encounter: Secondary | ICD-10-CM | POA: Diagnosis not present

## 2014-12-02 DIAGNOSIS — Z7952 Long term (current) use of systemic steroids: Secondary | ICD-10-CM | POA: Diagnosis not present

## 2014-12-02 DIAGNOSIS — Z720 Tobacco use: Secondary | ICD-10-CM | POA: Insufficient documentation

## 2014-12-02 DIAGNOSIS — R1011 Right upper quadrant pain: Secondary | ICD-10-CM | POA: Insufficient documentation

## 2014-12-02 DIAGNOSIS — Y9289 Other specified places as the place of occurrence of the external cause: Secondary | ICD-10-CM | POA: Insufficient documentation

## 2014-12-02 LAB — CBC WITH DIFFERENTIAL/PLATELET
BASOS ABS: 0 10*3/uL (ref 0.0–0.1)
Basophils Relative: 1 %
EOS PCT: 3 %
Eosinophils Absolute: 0.2 10*3/uL (ref 0.0–0.7)
HCT: 42 % (ref 39.0–52.0)
Hemoglobin: 14.5 g/dL (ref 13.0–17.0)
Lymphocytes Relative: 44 %
Lymphs Abs: 2.3 10*3/uL (ref 0.7–4.0)
MCH: 28.4 pg (ref 26.0–34.0)
MCHC: 34.5 g/dL (ref 30.0–36.0)
MCV: 82.4 fL (ref 78.0–100.0)
MONO ABS: 0.3 10*3/uL (ref 0.1–1.0)
Monocytes Relative: 6 %
Neutro Abs: 2.4 10*3/uL (ref 1.7–7.7)
Neutrophils Relative %: 46 %
PLATELETS: 187 10*3/uL (ref 150–400)
RBC: 5.1 MIL/uL (ref 4.22–5.81)
RDW: 14.9 % (ref 11.5–15.5)
WBC: 5.2 10*3/uL (ref 4.0–10.5)

## 2014-12-02 LAB — COMPREHENSIVE METABOLIC PANEL
ALK PHOS: 47 U/L (ref 38–126)
ALT: 16 U/L — ABNORMAL LOW (ref 17–63)
AST: 24 U/L (ref 15–41)
Albumin: 4.1 g/dL (ref 3.5–5.0)
Anion gap: 12 (ref 5–15)
BILIRUBIN TOTAL: 1 mg/dL (ref 0.3–1.2)
BUN: 12 mg/dL (ref 6–20)
CALCIUM: 8.7 mg/dL — AB (ref 8.9–10.3)
CO2: 22 mmol/L (ref 22–32)
CREATININE: 1.25 mg/dL — AB (ref 0.61–1.24)
Chloride: 97 mmol/L — ABNORMAL LOW (ref 101–111)
GFR calc Af Amer: >60 mL/min (ref >60)
GFR calc non Af Amer: >60 mL/min (ref >60)
Glucose, Bld: 82 mg/dL (ref 65–99)
Potassium: 3.3 mmol/L — ABNORMAL LOW (ref 3.5–5.1)
Sodium: 131 mmol/L — ABNORMAL LOW (ref 135–145)
TOTAL PROTEIN: 6.9 g/dL (ref 6.5–8.1)

## 2014-12-02 LAB — LIPASE, BLOOD: LIPASE: 16 U/L — AB (ref 22–51)

## 2014-12-02 MED ORDER — PREDNISONE 20 MG PO TABS: 60.0000 mg | ORAL_TABLET | Freq: Every day | ORAL | Status: DC

## 2014-12-02 MED ORDER — AMLODIPINE BESYLATE 5 MG PO TABS: 5.0000 mg | ORAL_TABLET | Freq: Every day | ORAL | Status: DC

## 2014-12-02 MED ORDER — SODIUM CHLORIDE 0.9 % IV BOLUS (SEPSIS)
1000.0000 mL | Freq: Once | INTRAVENOUS | Status: AC
  Administered 2014-12-02: 1000 mL via INTRAVENOUS

## 2014-12-02 MED ORDER — FAMOTIDINE IN NACL 20-0.9 MG/50ML-% IV SOLN
20.0000 mg | Freq: Once | INTRAVENOUS | Status: AC
Start: 2014-12-02 — End: 2014-12-02
  Administered 2014-12-02: 20 mg via INTRAVENOUS
  Filled 2014-12-02: qty 50

## 2014-12-02 MED ORDER — METHYLPREDNISOLONE SODIUM SUCC 125 MG IJ SOLR
125.0000 mg | Freq: Once | INTRAMUSCULAR | Status: AC
  Administered 2014-12-02: 125 mg via INTRAVENOUS
  Filled 2014-12-02: qty 2

## 2014-12-02 NOTE — ED Provider Notes (Signed)
CSN: [id number]     Arrival date & time 12/02/14  1347 History   First MD Initiated Contact with Patient 12/02/14 1358     Chief Complaint  Patient presents with  . Allergic Reaction  . Angioedema     (Consider location/radiation/quality/duration/timing/severity/associated sxs/prior Treatment) Patient is a 49 y.o. male presenting with allergic reaction. The history is provided by the patient.  Allergic Reaction Presenting symptoms: difficulty swallowing   Presenting symptoms: no rash    patient has had swelling in his lips. States it began around 2 days ago. States she's had this before and was seen in the ER for it. States that Tylenol and Benadryl. States this times been taking Benadryl at home and it did not help. No fevers. States he has some trouble eating solid foods now. He is having no trouble with liquids. No trouble breathing. No nausea or vomiting. He is on a blood pressure medicine but does not know what it was. No family history of swelling. He has had Benadryl today already.  Patient also states he has had some upper abdominal pain for a while now. States that in his right upper abdomen under the ribs. Previous history of pancreatic cancer but states he has been told recently he was cancer free. No nausea vomiting. He will occasionally drink some alcohol. He is a smoker.  Past Medical History  Diagnosis Date  . Pancreatic cancer [facility])    Past Surgical History  Procedure Laterality Date  . Cervical fusion    . Back surgery     No family history on file. Social History  Substance Use Topics  . Smoking status: Current Every Day Smoker -- 0.10 packs/day    Types: Cigarettes  . Smokeless tobacco: None  . Alcohol Use: Yes    Review of Systems  Constitutional: Negative for activity change and appetite change.  HENT: Positive for facial swelling and trouble swallowing. Negative for sneezing, sore throat and voice change.   Eyes: Negative for pain.  Respiratory: Negative  for chest tightness and shortness of breath.   Cardiovascular: Negative for chest pain and leg swelling.  Gastrointestinal: Positive for abdominal pain. Negative for nausea, vomiting and diarrhea.  Genitourinary: Negative for flank pain.  Musculoskeletal: Negative for back pain and neck stiffness.  Skin: Negative for rash.  Neurological: Negative for weakness, numbness and headaches.  Psychiatric/Behavioral: Negative for behavioral problems.      Allergies  Morphine and related  Home Medications   Prior to Admission medications   Medication Sig Start Date End Date Taking? Authorizing Provider  albuterol (PROVENTIL HFA;VENTOLIN HFA) 108 (90 BASE) MCG/ACT inhaler Inhale 2 puffs into the lungs every 6 (six) hours as needed for wheezing or shortness of breath.   Yes Historical Provider, MD  citalopram (CELEXA) 20 MG tablet Take 20 mg by mouth daily.   Yes Historical Provider, MD  omeprazole (PRILOSEC) 20 MG capsule Take 20 mg by mouth daily. 11/27/14  Yes Historical Provider, MD  amLODipine (NORVASC) 5 MG tablet Take 1 tablet (5 mg total) by mouth daily. 12/02/14   Davonna Belling, MD  ivermectin (STROMECTOL) 3 MG TABS tablet Take 6 tablets by mouth today.  Take another 6 tablets in 2 weeks. Patient not taking: Reported on 07/21/2014 01/25/14   Melony Overly, MD  meloxicam (MOBIC) 7.5 MG tablet Take 7.5 mg by mouth daily as needed for pain.  11/27/14   Historical Provider, MD  permethrin (ELIMITE) 5 % cream Apply from neck down, leave 12 hours  then shower Repeat x 1 treatment in one week. Patient not taking: Reported on 07/21/2014 01/25/14   Melony Overly, MD  predniSONE (DELTASONE) 20 MG tablet Take 3 tablets (60 mg total) by mouth daily. 12/03/14   Davonna Belling, MD   BP 106/66 mmHg  Pulse 72  Temp(Src) 99.2 F (37.3 C) (Oral)  Resp 22  Ht 6\' 1"  (1.854 m)  Wt 194 lb (87.998 kg)  BMI 25.60 kg/m2  SpO2 99% Physical Exam  Constitutional: He appears well-developed and well-nourished.   HENT:  Swelling of bilateral lips.  no erythema. posterior pharyngeal exam normal. Does have some missing teeth but no tenderness over his jaw. No swelling of his tongue. No stridor.   Neck: Neck supple.  Cardiovascular: Normal rate.   Pulmonary/Chest: Effort normal.  Abdominal: Soft.  Musculoskeletal: Normal range of motion.  Neurological: He is alert.  Skin: Skin is warm.  Psychiatric:  Patient appears anxious    ED Course  Procedures (including critical care time) Labs Review Labs Reviewed  COMPREHENSIVE METABOLIC PANEL  LIPASE, BLOOD  CBC WITH DIFFERENTIAL/PLATELET    Imaging Review No results found. I have personally reviewed and evaluated these images and lab results as part of my medical decision-making.   EKG Interpretation None      MDM   Final diagnoses:  Angioedema, initial encounter    Patient with angioedema of his lips. Likely due to ACE inhibitor. Has been stable here. Will stop this and start a low-dose of Norvasc. Will need follow-up. Also reported abdominal pain. Will get basic lab work    Davonna Belling, MD 12/02/14 [phone]

## 2014-12-02 NOTE — Discharge Instructions (Signed)
Angioedema °Angioedema is a sudden swelling of tissues, often of the skin. It can occur on the face or genitals or in the abdomen or other body parts. The swelling usually develops over a short period and gets better in 24 to 48 hours. It often begins during the night and is found when the person wakes up. The person may also get red, itchy patches of skin (hives). Angioedema can be dangerous if it involves swelling of the air passages.  °Depending on the cause, episodes of angioedema may only happen once, come back in unpredictable patterns, or repeat for several years and then gradually fade away.  °CAUSES  °Angioedema can be caused by an allergic reaction to various triggers. It can also result from nonallergic causes, including reactions to drugs, immune system disorders, viral infections, or an abnormal gene that is passed to you from your parents (hereditary). For some people with angioedema, the cause is unknown.  °Some things that can trigger angioedema include:  °· Foods.   °· Medicines, such as ACE inhibitors, ARBs, nonsteroidal anti-inflammatory agents, or estrogen.   °· Latex.   °· Animal saliva.   °· Insect stings.   °· Dyes used in X-rays.   °· Mild injury.   °· Dental work. °· Surgery. °· Stress.   °· Sudden changes in temperature.   °· Exercise. °SIGNS AND SYMPTOMS  °· Swelling of the skin. °· Hives. If these are present, there is also intense itching. °· Redness in the affected area.   °· Pain in the affected area. °· Swollen lips or tongue. °· Breathing problems. This may happen if the air passages swell. °· Wheezing. °If internal organs are involved, there may be:  °· Nausea.   °· Abdominal pain.   °· Vomiting.   °· Difficulty swallowing.   °· Difficulty passing urine. °DIAGNOSIS  °· Your health care provider will examine the affected area and take a medical and family history. °· Various tests may be done to help determine the cause. Tests may include: °¨ Allergy skin tests to see if the problem  is an allergic reaction.   °¨ Blood tests to check for hereditary angioedema.   °¨ Tests to check for underlying diseases that could cause the condition.   °· A review of your medicines, including over-the-counter medicines, may be done. °TREATMENT  °Treatment will depend on the cause of the angioedema. Possible treatments include:  °· Removal of anything that triggered the condition (such as stopping certain medicines).   °· Medicines to treat symptoms or prevent attacks. Medicines given may include:   °¨ Antihistamines.   °¨ Epinephrine injection.   °¨ Steroids.   °· Hospitalization may be required for severe attacks. If the air passages are affected, it can be an emergency. Tubes may need to be placed to keep the airway open. °HOME CARE INSTRUCTIONS  °· Take all medicines as directed by your health care provider. °· If you were given medicines for emergency allergy treatment, always carry them with you. °· Wear a medical bracelet as directed by your health care provider.   °· Avoid known triggers. °SEEK MEDICAL CARE IF:  °· You have repeat attacks of angioedema.   °· Your attacks are more frequent or more severe despite preventive measures.   °· You have hereditary angioedema and are considering having children. It is important to discuss with your health care provider the risks of passing the condition on to your children. °SEEK IMMEDIATE MEDICAL CARE IF:  °· You have severe swelling of the mouth, tongue, or lips. °· You have difficulty breathing.   °· You have difficulty swallowing.   °· You faint. °MAKE   SURE YOU: °· Understand these instructions. °· Will watch your condition. °· Will get help right away if you are not doing well or get worse. °Document Released: 04/28/2001 Document Revised: 07/04/2013 Document Reviewed: 10/11/2012 °[employer]® Patient Information ©2015 [employer]. This information is not intended to replace advice given to you by your health care provider. Make sure you discuss any questions  you have with your health care provider. ° °

## 2014-12-02 NOTE — ED Notes (Signed)
Pt c/o swelling to lips onset yesterday. Pt tried benadryl over the counter.

## 2014-12-02 NOTE — ED Notes (Signed)
Pt stable, ambulatory, states understanding of discharge instructions 

## 2014-12-07 ENCOUNTER — Ambulatory Visit: Payer: Medicare HMO | Admitting: Infectious Disease

## 2014-12-08 ENCOUNTER — Emergency Department (HOSPITAL_COMMUNITY): Payer: Medicare Other

## 2014-12-08 ENCOUNTER — Encounter (HOSPITAL_COMMUNITY): Payer: Self-pay

## 2014-12-08 ENCOUNTER — Emergency Department (HOSPITAL_COMMUNITY)
Admission: EM | Admit: 2014-12-08 | Discharge: 2014-12-09 | Disposition: A | Discharge status: Home / Self Care | Payer: Medicare Other | Attending: Emergency Medicine | Admitting: Emergency Medicine

## 2014-12-08 DIAGNOSIS — I10 Essential (primary) hypertension: Secondary | ICD-10-CM | POA: Diagnosis not present

## 2014-12-08 DIAGNOSIS — Z8507 Personal history of malignant neoplasm of pancreas: Secondary | ICD-10-CM | POA: Diagnosis not present

## 2014-12-08 DIAGNOSIS — Z720 Tobacco use: Secondary | ICD-10-CM | POA: Insufficient documentation

## 2014-12-08 DIAGNOSIS — Z79899 Other long term (current) drug therapy: Secondary | ICD-10-CM | POA: Insufficient documentation

## 2014-12-08 DIAGNOSIS — W1839XA Other fall on same level, initial encounter: Secondary | ICD-10-CM | POA: Insufficient documentation

## 2014-12-08 DIAGNOSIS — S0003XA Contusion of scalp, initial encounter: Secondary | ICD-10-CM

## 2014-12-08 DIAGNOSIS — S3992XA Unspecified injury of lower back, initial encounter: Secondary | ICD-10-CM | POA: Diagnosis not present

## 2014-12-08 DIAGNOSIS — S0990XA Unspecified injury of head, initial encounter: Secondary | ICD-10-CM | POA: Diagnosis present

## 2014-12-08 DIAGNOSIS — Y998 Other external cause status: Secondary | ICD-10-CM | POA: Diagnosis not present

## 2014-12-08 DIAGNOSIS — M545 Low back pain: Secondary | ICD-10-CM

## 2014-12-08 DIAGNOSIS — Y9289 Other specified places as the place of occurrence of the external cause: Secondary | ICD-10-CM | POA: Diagnosis not present

## 2014-12-08 DIAGNOSIS — Y9389 Activity, other specified: Secondary | ICD-10-CM | POA: Diagnosis not present

## 2014-12-08 DIAGNOSIS — S199XXA Unspecified injury of neck, initial encounter: Secondary | ICD-10-CM | POA: Insufficient documentation

## 2014-12-08 DIAGNOSIS — Z7952 Long term (current) use of systemic steroids: Secondary | ICD-10-CM | POA: Diagnosis not present

## 2014-12-08 DIAGNOSIS — M542 Cervicalgia: Secondary | ICD-10-CM

## 2014-12-08 DIAGNOSIS — T148XXA Other injury of unspecified body region, initial encounter: Secondary | ICD-10-CM

## 2014-12-08 HISTORY — DX: Essential (primary) hypertension: I10

## 2014-12-08 LAB — I-STAT CHEM 8, ED
BUN: 10 mg/dL (ref 6–20)
CHLORIDE: 103 mmol/L (ref 101–111)
Calcium, Ion: 1.18 mmol/L (ref 1.12–1.23)
Creatinine, Ser: 0.9 mg/dL (ref 0.61–1.24)
Glucose, Bld: 77 mg/dL (ref 65–99)
HCT: 44 % (ref 39.0–52.0)
Hemoglobin: 15 g/dL (ref 13.0–17.0)
Potassium: 3.9 mmol/L (ref 3.5–5.1)
SODIUM: 140 mmol/L (ref 135–145)
TCO2: 24 mmol/L (ref 0–100)

## 2014-12-08 LAB — CBC WITH DIFFERENTIAL/PLATELET
BASOS ABS: 0 10*3/uL (ref 0.0–0.1)
BASOS PCT: 0 %
EOS ABS: 0.3 10*3/uL (ref 0.0–0.7)
Eosinophils Relative: 4 %
HCT: 37.5 % — ABNORMAL LOW (ref 39.0–52.0)
Hemoglobin: 13.2 g/dL (ref 13.0–17.0)
Lymphocytes Relative: 35 %
Lymphs Abs: 2.5 10*3/uL (ref 0.7–4.0)
MCH: 28.2 pg (ref 26.0–34.0)
MCHC: 35.2 g/dL (ref 30.0–36.0)
MCV: 80.1 fL (ref 78.0–100.0)
MONO ABS: 0.5 10*3/uL (ref 0.1–1.0)
MONOS PCT: 7 %
Neutro Abs: 3.7 10*3/uL (ref 1.7–7.7)
Neutrophils Relative %: 54 %
PLATELETS: 218 10*3/uL (ref 150–400)
RBC: 4.68 MIL/uL (ref 4.22–5.81)
RDW: 14.9 % (ref 11.5–15.5)
WBC: 6.9 10*3/uL (ref 4.0–10.5)

## 2014-12-08 LAB — PROTIME-INR
INR: 1.09 (ref 0.00–1.49)
PROTHROMBIN TIME: 14.3 s (ref 11.6–15.2)

## 2014-12-08 LAB — BASIC METABOLIC PANEL
ANION GAP: 10 (ref 5–15)
BUN: 10 mg/dL (ref 6–20)
CALCIUM: 9.2 mg/dL (ref 8.9–10.3)
CO2: 26 mmol/L (ref 22–32)
CREATININE: 0.93 mg/dL (ref 0.61–1.24)
Chloride: 101 mmol/L (ref 101–111)
Glucose, Bld: 74 mg/dL (ref 65–99)
Potassium: 3.9 mmol/L (ref 3.5–5.1)
SODIUM: 137 mmol/L (ref 135–145)

## 2014-12-08 LAB — I-STAT TROPONIN, ED: TROPONIN I, POC: 0 ng/mL (ref 0.00–0.08)

## 2014-12-08 LAB — CBG MONITORING, ED: GLUCOSE-CAPILLARY: 71 mg/dL (ref 65–99)

## 2014-12-08 MED ORDER — ONDANSETRON HCL 4 MG/2ML IJ SOLN
4.0000 mg | Freq: Once | INTRAMUSCULAR | Status: AC
  Administered 2014-12-08: 4 mg via INTRAVENOUS
  Filled 2014-12-08: qty 2

## 2014-12-08 MED ORDER — KETOROLAC TROMETHAMINE 15 MG/ML IJ SOLN
15.0000 mg | Freq: Once | INTRAMUSCULAR | Status: AC
  Administered 2014-12-08: 15 mg via INTRAVENOUS
  Filled 2014-12-08: qty 1

## 2014-12-08 MED ORDER — FENTANYL CITRATE (PF) 100 MCG/2ML IJ SOLN
50.0000 ug | Freq: Once | INTRAMUSCULAR | Status: AC
  Administered 2014-12-08: 50 ug via INTRAVENOUS
  Filled 2014-12-08: qty 2

## 2014-12-08 NOTE — ED Notes (Signed)
Per EMS: Pt had unwitnessed fall. Unknown if LOC. Pt complaining of pain to L forehead, small lac to L side of chin. Pt also complaining of pain at base of neck. Pt states hx hypertension and seizures. Pt does not remember fall, just waking up on floor. EMS states BP 130/87, HR 110. CBG = 87. Sheriff at bedside.

## 2014-12-08 NOTE — ED Provider Notes (Signed)
CSN: [id number]     Arrival date & time 12/08/14  1952 History   First MD Initiated Contact with Patient 12/08/14 2028     Chief Complaint  Patient presents with  . Fall    (Consider location/radiation/quality/duration/timing/severity/associated sxs/prior Treatment) HPI Comments: 49 year old male with history of pancreatic cancer and hypertension, also reporting hx of prior seizures despite not being on seizure medication as well as a hx of CVA for which he went to rehab, presents to the ED for evaluation of AMS and unwitnessed fall. Patient reports he last remembers laying on his bed in his jail cell when the next thing he knew he was on the floor with security guards over top of him. Patient c/o constant headache at the site of a scalp hematoma as well as neck and back pain. Patient states that pain is constant. No medications given PTA. Patient also stating that his b/l vision is "cloudy", like his vision is enclosed with black circles. He c/o nausea with emesis at the jail PTA. No chest pain, SOB, or incontinence. Patient states his last tetanus shot was 2 years ago. He states that he discontinued taking seizure medication 2 years ago "just because".   Patient is a 49 y.o. male presenting with fall. The history is provided by the patient. No language interpreter was used.  Fall Associated symptoms include nausea, neck pain, vomiting and weakness. Pertinent negatives include no fever.    Past Medical History  Diagnosis Date  . Pancreatic cancer ([place])   . Hypertension    Past Surgical History  Procedure Laterality Date  . Cervical fusion    . Back surgery     History reviewed. No pertinent family history. Social History  Substance Use Topics  . Smoking status: Current Every Day Smoker -- 0.10 packs/day    Types: Cigarettes  . Smokeless tobacco: None  . Alcohol Use: Yes    Review of Systems  Constitutional: Negative for fever.  Eyes: Positive for visual disturbance.   Gastrointestinal: Positive for nausea and vomiting.  Musculoskeletal: Positive for back pain and neck pain.  Skin: Positive for wound.  Neurological: Positive for syncope and weakness.  All other systems reviewed and are negative.   Allergies  Morphine and related and Naproxen  Home Medications   Prior to Admission medications   Medication Sig Start Date End Date Taking? Authorizing Provider  albuterol (PROVENTIL HFA;VENTOLIN HFA) 108 (90 BASE) MCG/ACT inhaler Inhale 2 puffs into the lungs every 6 (six) hours as needed for wheezing or shortness of breath.   Yes Historical Provider, MD  amLODipine (NORVASC) 5 MG tablet Take 1 tablet (5 mg total) by mouth daily. 12/02/14  Yes Davonna Belling, MD  citalopram (CELEXA) 20 MG tablet Take 20 mg by mouth daily.   Yes Historical Provider, MD  meloxicam (MOBIC) 7.5 MG tablet Take 7.5 mg by mouth daily as needed for pain.  11/27/14  Yes Historical Provider, MD  omeprazole (PRILOSEC) 20 MG capsule Take 20 mg by mouth daily. 11/27/14  Yes Historical Provider, MD  oxyCODONE-acetaminophen (PERCOCET) 10-325 MG tablet Take 1 tablet by mouth every 4 (four) hours as needed for pain.   Yes Historical Provider, MD  predniSONE (DELTASONE) 20 MG tablet Take 3 tablets (60 mg total) by mouth daily. 12/03/14  Yes Davonna Belling, MD  traZODone (DESYREL) 50 MG tablet Take 50 mg by mouth at bedtime as needed for sleep.   Yes Historical Provider, MD  ivermectin (STROMECTOL) 3 MG TABS tablet Take 6 tablets  by mouth today.  Take another 6 tablets in 2 weeks. Patient not taking: Reported on 07/21/2014 01/25/14   Melony Overly, MD  permethrin (ELIMITE) 5 % cream Apply from neck down, leave 12 hours then shower Repeat x 1 treatment in one week. Patient not taking: Reported on 07/21/2014 01/25/14   Melony Overly, MD   BP 152/90 mmHg  Pulse 66  Temp(Src) 98.2 F (36.8 C) (Oral)  Resp 13  SpO2 98%   Physical Exam  Constitutional: He is oriented to person, place, and  time. He appears well-developed and well-nourished. No distress.  Nontoxic/nonseptic appearing  HENT:  Head: Normocephalic.  Mouth/Throat: Oropharynx is clear and moist. No oropharyngeal exudate.  Hematoma to L upper scalp. Contusion noted to L lower lip. 0.5cm superficial laceration to L chin. No battle's sign or raccoon's eyes. Symmetric rise of the uvula with phonation.  Eyes: Conjunctivae and EOM are normal. Pupils are equal, round, and reactive to light. No scleral icterus.  Normal EOMs. No nystagmus noted.  Neck: Normal range of motion.  Cardiovascular: Normal rate, regular rhythm and intact distal pulses.   Pulmonary/Chest: Effort normal. No respiratory distress.  Respirations even and unlabored  Musculoskeletal: Normal range of motion.  Neurological: He is alert and oriented to person, place, and time. He exhibits abnormal muscle tone.  GCS 15. Speech is goal oriented. Symmetric eyebrow raise, tongue midline. Patient reports intact sensation in all extremities. Absent grip strength on the right. Patient not moving RLE and RUE, states that he cannot. Strength against resistance 5/5 in the LUE and LLE. Grip strength on the left is 5/5.  Skin: Skin is warm and dry. No rash noted. He is not diaphoretic. No erythema. No pallor.  Psychiatric: He has a normal mood and affect. His behavior is normal.  Nursing note and vitals reviewed.   ED Course  Procedures (including critical care time) Labs Review Labs Reviewed  CBC WITH DIFFERENTIAL/PLATELET - Abnormal; Notable for the following:    HCT 37.5 (*)    All other components within normal limits  BASIC METABOLIC PANEL  PROTIME-INR  I-STAT CHEM 8, ED  CBG MONITORING, ED  I-STAT TROPOININ, ED    Imaging Review Dg Lumbar Spine Complete  12/08/2014   CLINICAL DATA:  Status post fall, with lower back pain. Initial encounter.  EXAM: LUMBAR SPINE - COMPLETE 4+ VIEW  COMPARISON:  Lumbar spine radiographs performed 07/21/2014  FINDINGS:  There is no evidence of fracture or subluxation. The patient is status post lumbar spinal fusion at L4-L5; the patient has 6 lumbar vertebral bodies. Vertebral bodies demonstrate normal height and alignment. Intervertebral disc spaces are preserved. The visualized neural foramina are grossly unremarkable in appearance.  The visualized bowel gas pattern is unremarkable in appearance; air and stool are noted within the colon. The sacroiliac joints are within normal limits.  IMPRESSION: No evidence of fracture or subluxation along the lumbar spine. Status post lumbar spinal fusion at L4-L5.   Electronically Signed   By: Garald Balding M.D.   On: 12/08/2014 21:36   Ct Head Wo Contrast  12/08/2014   CLINICAL DATA:  Unwitnessed fall. Found on the floor. Left frontal headache. Right-sided neck pain.  EXAM: CT HEAD WITHOUT CONTRAST  CT CERVICAL SPINE WITHOUT CONTRAST  TECHNIQUE: Multidetector CT imaging of the head and cervical spine was performed following the standard protocol without intravenous contrast. Multiplanar CT image reconstructions of the cervical spine were also generated.  COMPARISON:  07/21/2014  FINDINGS: CT HEAD FINDINGS  The brain has a normal appearance without evidence of malformation, atrophy, old or acute infarction, mass lesion, hemorrhage, hydrocephalus or extra-axial collection. The calvarium is unremarkable. The paranasal sinuses, middle ears and mastoids are clear.  CT CERVICAL SPINE FINDINGS  No acute or traumatic finding. 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  C2-3:  Facet degeneration on the right.  No stenosis.  C3 through C7: Previous ACDF. There is an anterior plate with screws at C3, C4 and C5. C3-4 solidly fused. 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 There is nitrogen gas in the disc space. Lucency around the C5 screws is consistent with ongoing motion. There is solid fusion at C5-6 and C6-7.  At C7-T1, there is bilateral facet arthropathy with 2 mm of  anterolisthesis. There is mild foraminal narrowing because of osteophytic encroachment.  IMPRESSION: Head CT:  Normal.  Cervical spine CT: No acute or traumatic finding. Previous ACDF from C3 through C7. 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 This is not an acute finding but could be associated with ongoing neck pain. There is also considerable facet arthropathy at the C7-T1 level which could be a cause of neck pain.   Electronically Signed   By: Nelson Chimes M.D.   On: 12/08/2014 21:56   Ct Cervical Spine Wo Contrast  12/08/2014   CLINICAL DATA:  Unwitnessed fall. Found on the floor. Left frontal headache. Right-sided neck pain.  EXAM: CT HEAD WITHOUT CONTRAST  CT CERVICAL SPINE WITHOUT CONTRAST  TECHNIQUE: Multidetector CT imaging of the head and cervical spine was performed following the standard protocol without intravenous contrast. Multiplanar CT image reconstructions of the cervical spine were also generated.  COMPARISON:  07/21/2014  FINDINGS: CT HEAD FINDINGS  The brain has a normal appearance without evidence of malformation, atrophy, old or acute infarction, mass lesion, hemorrhage, hydrocephalus or extra-axial collection. The calvarium is unremarkable. The paranasal sinuses, middle ears and mastoids are clear.  CT CERVICAL SPINE FINDINGS  No acute or traumatic finding. 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  C2-3:  Facet degeneration on the right.  No stenosis.  C3 through C7: Previous ACDF. There is an anterior plate with screws at C3, C4 and C5. C3-4 solidly fused. 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 There is nitrogen gas in the disc space. Lucency around the C5 screws is consistent with ongoing motion. There is solid fusion at C5-6 and C6-7.  At C7-T1, there is bilateral facet arthropathy with 2 mm of anterolisthesis. There is mild foraminal narrowing because of osteophytic  encroachment.  IMPRESSION: Head CT:  Normal.  Cervical spine CT: No acute or traumatic finding. Previous ACDF from C3 through C7. 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 This is not an acute finding but could be associated with ongoing neck pain. There is also considerable facet arthropathy at the C7-T1 level which could be a cause of neck pain.   Electronically Signed   By: Nelson Chimes M.D.   On: 12/08/2014 21:56   Mr Brain Wo Contrast  12/09/2014   CLINICAL DATA:  Unwitnessed fall, unknown loss of consciousness. LEFT forehead pain. Chin laceration. History of pancreatic cancer, hypertension, seizures.  EXAM: MRI HEAD WITHOUT CONTRAST  TECHNIQUE: Multiplanar, multiecho pulse sequences of the brain and surrounding structures were  obtained without intravenous contrast.  COMPARISON:  CT head December 08, 2014  FINDINGS: Multiple sequences are mildly motion degraded.  The ventricles and sulci are normal for patient's age. No abnormal parenchymal signal, mass lesions, mass effect. No reduced diffusion to suggest acute ischemia. No susceptibility artifact to suggest hemorrhage.  No abnormal extra-axial fluid collections. No extra-axial masses though, contrast enhanced sequences would be more sensitive. Normal major intracranial vascular flow voids seen at the skull base.  Ocular globes and orbital contents are unremarkable though not tailored for evaluation. No abnormal sellar expansion. Visualized paranasal sinuses and mastoid air cells are well-aerated. No suspicious calvarial bone marrow signal. Craniocervical junction maintained. LEFT frontal scalp soft tissue swelling likely represents hematoma.  IMPRESSION: Normal noncontrast MRI of the brain.  LEFT frontal scalp soft tissue swelling/hematoma.   Electronically Signed   By: Elon Alas M.D.   On: 12/09/2014 01:15     I have personally reviewed and evaluated  these images and lab results as part of my medical decision-making.   EKG Interpretation None      2205 - Case discussed with Dr. Nicole Kindred of Neurology including RUE and RLE weakness and hx of CVA. He recommends MRI given reported hx of CVA with outpatient f/u if MRI is negative. MDM   Final diagnoses:  Contusion of left temporofrontal scalp, initial encounter  Superficial laceration  Neck pain  Low back pain without sciatica, unspecified back pain laterality    49 year old male presents to the emergency department after an unwitnessed fall [facility] jail. Patient with a hematoma to his left temporofrontal scalp. No underlying bony instability, battle sign, or raccoon's eyes. Patient reported inability to move his right side on initial exam. During more frequent encounters, patient was seen to spontaneously be moving his right upper and right lower extremities. There is no documented history in the chart of patient having had a CVA; however, patient states that he had a stroke in the past for which she went to rehabilitation. CT and MRI today showed no evidence of acute intracranial process or stroke.   Patient also complaining of neck and back pain. Imaging of neck and back are unremarkable; no evidence of acute fracture, dislocation, or bony deformity. No red flags or signs concerning for cauda equina today. Laboratory workup is noncontributory. [place] syncope score is negative.  Do not believe further emergent workup is indicated at this time. Patient to follow-up with a primary care provider. He also reports a history of seizures, but he is not currently on any seizure medications. He states that he discontinued these medicines 2 years ago. Unable to rule out seizure as cause of LOC, though I have a low suspicion for this today. Will refer to neurology for further workup and evaluation. Return precautions discussed and provided. Patient agreeable to plan with no unaddressed concerns. Patient  discharged in good condition; VSS.   Filed Vitals:   12/08/14 2330 12/08/14 2345 12/09/14 0135 12/09/14 0136  BP: 131/86 129/81 139/86   Pulse: 56 79  62  Temp:      TempSrc:      Resp: 19 18    SpO2: 98% 95%  98%     Antonietta Breach, PA-C 12/09/14 2130  Virgel Manifold, MD 12/14/14 1014

## 2014-12-09 DIAGNOSIS — S0003XA Contusion of scalp, initial encounter: Secondary | ICD-10-CM | POA: Diagnosis not present

## 2014-12-09 MED ORDER — ACETAMINOPHEN 500 MG PO TABS
500.0000 mg | ORAL_TABLET | Freq: Once | ORAL | Status: AC
  Administered 2014-12-09: 500 mg via ORAL
  Filled 2014-12-09: qty 1

## 2014-12-09 MED ORDER — ACETAMINOPHEN 500 MG PO TABS: 500.0000 mg | ORAL_TABLET | Freq: Four times a day (QID) | ORAL | Status: DC | PRN

## 2014-12-09 MED ORDER — LORAZEPAM 2 MG/ML IJ SOLN
1.0000 mg | Freq: Once | INTRAMUSCULAR | Status: AC
  Administered 2014-12-09: 1 mg via INTRAVENOUS
  Filled 2014-12-09: qty 1

## 2014-12-09 NOTE — Discharge Instructions (Signed)
Facial or Scalp Contusion A facial or scalp contusion is a deep bruise on the face or head. Injuries to the face and head generally cause a lot of swelling, especially around the eyes. Contusions are the result of an injury that caused bleeding under the skin. The contusion may turn [place], purple, or yellow. Minor injuries will give you a painless contusion, but more severe contusions may stay painful and swollen for a few weeks.  CAUSES  A facial or scalp contusion is caused by a blunt injury or trauma to the face or head area.  SIGNS AND SYMPTOMS   Swelling of the injured area.   Discoloration of the injured area.   Tenderness, soreness, or pain in the injured area.  DIAGNOSIS  The diagnosis can be made by taking a medical history and doing a physical exam. An X-ray exam, CT scan, or MRI may be needed to determine if there are any associated injuries, such as broken bones (fractures). TREATMENT  Often, the best treatment for a facial or scalp contusion is applying cold compresses to the injured area. Over-the-counter medicines may also be recommended for pain control.  HOME CARE INSTRUCTIONS   Only take over-the-counter or prescription medicines as directed by your health care provider.   Apply ice to the injured area.   Put ice in a plastic bag.   Place a towel between your skin and the bag.   Leave the ice on for 20 minutes, 2-3 times a day.  SEEK MEDICAL CARE IF:  You have bite problems.   You have pain with chewing.   You are concerned about facial defects. SEEK IMMEDIATE MEDICAL CARE IF:  You have severe pain or a headache that is not relieved by medicine.   You have unusual sleepiness, confusion, or personality changes.   You throw up (vomit).   You have a persistent nosebleed.   You have double vision or blurred vision.   You have fluid drainage from your nose or ear.   You have difficulty walking or using your arms or legs.  MAKE SURE YOU:    Understand these instructions.  Will watch your condition.  Will get help right away if you are not doing well or get worse.   This information is not intended to replace advice given to you by your health care provider. Make sure you discuss any questions you have with your health care provider.   Document Released: 03/27/2004 Document Revised: 03/10/2014 Document Reviewed: 09/30/2012 Elsevier Interactive Patient Education 2016 [place]. Muscle Strain A muscle strain is an injury that occurs when a muscle is stretched beyond its normal length. Usually a small number of muscle fibers are torn when this happens. Muscle strain is rated in degrees. First-degree strains have the least amount of muscle fiber tearing and pain. Second-degree and third-degree strains have increasingly more tearing and pain.  Usually, recovery from muscle strain takes 1-2 weeks. Complete healing takes 5-6 weeks.  CAUSES  Muscle strain happens when a sudden, violent force placed on a muscle stretches it too far. This may occur with lifting, sports, or a fall.  RISK FACTORS Muscle strain is especially common in athletes.  SIGNS AND SYMPTOMS At the site of the muscle strain, there may be:  Pain.  Bruising.  Swelling.  Difficulty using the muscle due to pain or lack of normal function. DIAGNOSIS  Your health care provider will perform a physical exam and ask about your medical history. TREATMENT  Often, the best treatment  for a muscle strain is resting, icing, and applying cold compresses to the injured area.  HOME CARE INSTRUCTIONS   Use the PRICE method of treatment to promote muscle healing during the first 2-3 days after your injury. The PRICE method involves:  Protecting the muscle from being injured again.  Restricting your activity and resting the injured body part.  Icing your injury. To do this, put ice in a plastic bag. Place a towel between your skin and the bag. Then, apply the ice and  leave it on from 15-20 minutes each hour. After the third day, switch to moist heat packs.  Apply compression to the injured area with a splint or elastic bandage. Be careful not to wrap it too tightly. This may interfere with blood circulation or increase swelling.  Elevate the injured body part above the level of your heart as often as you can.  Only take over-the-counter or prescription medicines for pain, discomfort, or fever as directed by your health care provider.  Warming up prior to exercise helps to prevent future muscle strains. SEEK MEDICAL CARE IF:   You have increasing pain or swelling in the injured area.  You have numbness, tingling, or a significant loss of strength in the injured area. MAKE SURE YOU:   Understand these instructions.  Will watch your condition.  Will get help right away if you are not doing well or get worse.   This information is not intended to replace advice given to you by your health care provider. Make sure you discuss any questions you have with your health care provider.   Document Released: 02/17/2005 Document Revised: 12/08/2012 Document Reviewed: 09/16/2012 Elsevier Interactive Patient Education [employer].

## 2015-05-04 ENCOUNTER — Emergency Department (HOSPITAL_COMMUNITY): Payer: Medicare Other

## 2015-05-04 ENCOUNTER — Encounter (HOSPITAL_COMMUNITY): Payer: Self-pay | Admitting: Emergency Medicine

## 2015-05-04 ENCOUNTER — Emergency Department (HOSPITAL_COMMUNITY)
Admission: EM | Admit: 2015-05-04 | Discharge: 2015-05-04 | Disposition: A | Discharge status: Home / Self Care | Payer: Medicare Other | Attending: Emergency Medicine | Admitting: Emergency Medicine

## 2015-05-04 DIAGNOSIS — F419 Anxiety disorder, unspecified: Secondary | ICD-10-CM | POA: Diagnosis not present

## 2015-05-04 DIAGNOSIS — F17210 Nicotine dependence, cigarettes, uncomplicated: Secondary | ICD-10-CM | POA: Diagnosis not present

## 2015-05-04 DIAGNOSIS — Z79899 Other long term (current) drug therapy: Secondary | ICD-10-CM | POA: Diagnosis not present

## 2015-05-04 DIAGNOSIS — M542 Cervicalgia: Secondary | ICD-10-CM

## 2015-05-04 DIAGNOSIS — Z8507 Personal history of malignant neoplasm of pancreas: Secondary | ICD-10-CM | POA: Diagnosis not present

## 2015-05-04 DIAGNOSIS — Z8719 Personal history of other diseases of the digestive system: Secondary | ICD-10-CM | POA: Insufficient documentation

## 2015-05-04 DIAGNOSIS — I10 Essential (primary) hypertension: Secondary | ICD-10-CM | POA: Insufficient documentation

## 2015-05-04 DIAGNOSIS — M79602 Pain in left arm: Secondary | ICD-10-CM | POA: Diagnosis present

## 2015-05-04 DIAGNOSIS — D170 Benign lipomatous neoplasm of skin and subcutaneous tissue of head, face and neck: Secondary | ICD-10-CM | POA: Insufficient documentation

## 2015-05-04 LAB — CBC WITH DIFFERENTIAL/PLATELET
Basophils Absolute: 0 10*3/uL (ref 0.0–0.1)
Basophils Relative: 0 %
Eosinophils Absolute: 0.1 10*3/uL (ref 0.0–0.7)
Eosinophils Relative: 3 %
HEMATOCRIT: 39 % (ref 39.0–52.0)
HEMOGLOBIN: 13.4 g/dL (ref 13.0–17.0)
LYMPHS ABS: 2.1 10*3/uL (ref 0.7–4.0)
LYMPHS PCT: 45 %
MCH: 28.6 pg (ref 26.0–34.0)
MCHC: 34.4 g/dL (ref 30.0–36.0)
MCV: 83.2 fL (ref 78.0–100.0)
MONOS PCT: 4 %
Monocytes Absolute: 0.2 10*3/uL (ref 0.1–1.0)
NEUTROS ABS: 2.3 10*3/uL (ref 1.7–7.7)
NEUTROS PCT: 48 %
Platelets: 138 10*3/uL — ABNORMAL LOW (ref 150–400)
RBC: 4.69 MIL/uL (ref 4.22–5.81)
RDW: 15.7 % — ABNORMAL HIGH (ref 11.5–15.5)
WBC: 4.7 10*3/uL (ref 4.0–10.5)

## 2015-05-04 LAB — BASIC METABOLIC PANEL
Anion gap: 8 (ref 5–15)
BUN: 10 mg/dL (ref 6–20)
CHLORIDE: 109 mmol/L (ref 101–111)
CO2: 25 mmol/L (ref 22–32)
CREATININE: 1.04 mg/dL (ref 0.61–1.24)
Calcium: 8.7 mg/dL — ABNORMAL LOW (ref 8.9–10.3)
GFR calc Af Amer: >60 mL/min (ref >60)
GFR calc non Af Amer: >60 mL/min (ref >60)
Glucose, Bld: 91 mg/dL (ref 65–99)
POTASSIUM: 4.1 mmol/L (ref 3.5–5.1)
Sodium: 142 mmol/L (ref 135–145)

## 2015-05-04 MED ORDER — IOHEXOL 300 MG/ML  SOLN
75.0000 mL | Freq: Once | INTRAMUSCULAR | Status: AC | PRN
  Administered 2015-05-04: 75 mL via INTRAVENOUS

## 2015-05-04 NOTE — ED Notes (Signed)
Brought patient back to room, patient undressed, in gown, on monitor, continuous pulse oximetry and blood pressure cuff

## 2015-05-04 NOTE — ED Notes (Signed)
PA at bedside with patient

## 2015-05-04 NOTE — ED Notes (Signed)
Pt sts lump in back of neck where pt sts has had sx in past and now having pain and numbness in left arm; pt sts x 1 week

## 2015-05-04 NOTE — ED Provider Notes (Signed)
CSN: [id number]     Arrival date & time 05/04/15  1014 History   First MD Initiated Contact with Patient 05/04/15 1122     Chief Complaint  Patient presents with  . Arm Pain     (Consider location/radiation/quality/duration/timing/severity/associated sxs/prior Treatment) HPI   Fernando Diaz is a 50 y.o. male with PMH significant for pancreatic cancer, personality disorder, mood disorder, anxiety, HTN who presents with 1 week history of sudden onset, constant, moderate left arm pain and parasthesias he describes as "sharp and like a shock" that go all the way down to his fingers that seem to be worse at night.  He also states he noticed a "lump" on the back of his neck approximately 2 weeks ago.  No modifying or aggravating factors.  Associated symptoms include intermittent headache and chills.  Denies fever, syncope, weakness, CP, SOB, N/V/D, abdominal pain.  Patient reports hx of neck and back surgery.    Past Medical History  Diagnosis Date  . Pancreatic cancer ([place])   . Bleeding ulcer   . Personality disorder   . Mood disorder ([place])   . Anxiety disorder   . Hypertension    Past Surgical History  Procedure Laterality Date  . Tonsillectomy    . Back surgery     Family History  Problem Relation Age of Onset  . Cancer Mother   . Hypertension Father   . Depression Brother    Social History  Substance Use Topics  . Smoking status: Current Every Day Smoker -- 0.25 packs/day    Types: Cigarettes  . Smokeless tobacco: None     Comment: decrease in daily smoking   . Alcohol Use: No     Comment:  history of past alcohol use  goes to NA     Review of Systems All other systems negative unless otherwise stated in HPI    Allergies  Celebrex ; Fentanyl; Morphine and related; Other; and Ibuprofen  Home Medications   Prior to Admission medications   Medication Sig Start Date End Date Taking? Authorizing Provider  albuterol (PROVENTIL HFA;VENTOLIN HFA) 108 (90 Base) MCG/ACT  inhaler Inhale 1 puff into the lungs every 6 (six) hours as needed for wheezing or shortness of breath.   Yes Historical Provider, MD  citalopram (CELEXA) 20 MG tablet Take 20 mg by mouth daily.   Yes Historical Provider, MD  lisinopril-hydrochlorothiazide (PRINZIDE,ZESTORETIC) 20-12.5 MG per tablet Take 1 tablet by mouth daily.   Yes Historical Provider, MD  omeprazole (PRILOSEC) 20 MG capsule Take 20 mg by mouth daily.   Yes Historical Provider, MD  albuterol (PROVENTIL) (2.5 MG/3ML) 0.083% nebulizer solution Take 2.5 mg by nebulization every 6 (six) hours as needed for wheezing or shortness of breath.    Historical Provider, MD  naproxen (NAPROSYN) 500 MG tablet Take 1 tablet (500 mg total) by mouth 2 (two) times daily with a meal. Patient not taking: Reported on 11/16/2014 04/22/14   Ernestina Patches, MD  oxyCODONE-acetaminophen (PERCOCET) 10-325 MG per tablet Take 1 tablet by mouth every 8 (eight) hours as needed for pain.    Historical Provider, MD  pantoprazole (PROTONIX) 20 MG tablet Take 1 tablet (20 mg total) by mouth daily. Patient not taking: Reported on 04/21/2014 05/04/13   Noland Fordyce, PA-C  promethazine (PHENERGAN) 25 MG tablet Take 1 tablet (25 mg total) by mouth every 6 (six) hours as needed for nausea or vomiting. Patient not taking: Reported on 04/21/2014 05/04/13   Noland Fordyce, PA-C   BP 135/95  mmHg  Pulse 52  Temp(Src) 97.8 F (36.6 C) (Oral)  Resp 18  SpO2 100% Physical Exam  Constitutional: He is oriented to person, place, and time. He appears well-developed and well-nourished.  Non-toxic appearance. He does not have a sickly appearance. He does not appear ill.  HENT:  Head: Normocephalic and atraumatic.  Mouth/Throat: Oropharynx is clear and moist.  Eyes: Conjunctivae are normal. Pupils are equal, round, and reactive to light.  Neck: Normal range of motion. Neck supple.  Limited ROM of cervical spine (baseline).  Medium sized soft tissue mass consistent with lipoma at C7.  No overlying erythema, warmth, induration, or fluctuance.   Cardiovascular: Normal rate, regular rhythm and normal heart sounds.   No murmur heard. Pulmonary/Chest: Effort normal and breath sounds normal. No accessory muscle usage or stridor. No respiratory distress. He has no wheezes. He has no rhonchi. He has no rales.  Abdominal: Soft. Bowel sounds are normal. He exhibits no distension. There is no tenderness. There is no rebound and no guarding.  Musculoskeletal: Normal range of motion.  FAROM of left shoulder in flexion, extension, abduction, and adduction.  5/5 strength.  Lymphadenopathy:    He has no cervical adenopathy.  Neurological: He is alert and oriented to person, place, and time.  Speech clear without dysarthria.  Cranial nerves grossly intact.  Strength and sensation intact bilaterally throughout upper extremities.   Skin: Skin is warm and dry.  Psychiatric: He has a normal mood and affect. His behavior is normal.    ED Course  Procedures (including critical care time) Labs Review Labs Reviewed  CBC WITH DIFFERENTIAL/PLATELET - Abnormal; Notable for the following:    RDW 15.7 (*)    Platelets 138 (*)    All other components within normal limits  BASIC METABOLIC PANEL - Abnormal; Notable for the following:    Calcium 8.7 (*)    All other components within normal limits    Imaging Review Ct Soft Tissue Neck W Contrast  05/04/2015  CLINICAL DATA:  Palpable mass at the back of the neck, noticed 2 weeks ago. Regional pain. Personal history of pancreatic cancer. EXAM: CT NECK WITH CONTRAST TECHNIQUE: Multidetector CT imaging of the neck was performed using the standard protocol following the bolus administration of intravenous contrast. CONTRAST:  76mL OMNIPAQUE IOHEXOL 300 MG/ML  SOLN COMPARISON:  Cervical spine CT 04/22/2014. MRI cervical spine 04/24/2004. FINDINGS: Pharynx and larynx: No mucosal or submucosal lesion. Salivary glands: Parotid and submandibular glands are  normal and symmetric. Thyroid: Normal Lymph nodes: No enlarged or low-density nodes on either side of the neck. Vascular: Normal Limited intracranial: Normal Visualized orbits: Normal Mastoids and visualized paranasal sinuses: Clear Skeleton: Previous fusions C3 through C7. Degenerative facet arthropathy at C2-3 and C7-T1. In the dorsal midline behind the spinous processes of C5, C6 and C7, there is slight prominence of the subcutaneous fatty tissue. It is possible there could be a lipoma in this region measuring approximately 3.4 x 1.4 x 5.8 cm. There is similar appearance to the study of February 2016 however. When compared older studies, including an MRI from 2006 and CT from 2005, that fat tissue does appear more prominent. Upper chest: Normal IMPRESSION: Findings consistent with a lipoma in the subcutaneous fat behind the spinous processes of C5, C6 and C7. Approximate measurements are 5.8 cm right to left, 3.4 cm cephalocaudal and 1.4 cm thickness. This appears similar to the study of last year, but is larger than studies of 10 years ago. Electronically Signed  By: Nelson Chimes M.D.   On: 05/04/2015 13:59   I have personally reviewed and evaluated these images and lab results as part of my medical decision-making.   EKG Interpretation None      MDM   Final diagnoses:  Neck pain  Lipoma of neck    Patient with left arm pain and neck pain.  VSS, NAD.  Will obtain basic labs and CT cervical spine to evaluate for soft tissue infection given pancreatic cancer history.  Patient seen by Dr. Eulis Foster as well who agrees and acknowledges the above plan.   Labs without acute abnormalities.  CT cervical spine remarkable for lipoma in subcutaneous fat behind spinous process of C5, C6 and C7.  Evaluation does not show pathology requiring ongoing emergent intervention or admission. Pt is hemodynamically stable and mentating appropriately. Discussed findings/results and plan with patient/guardian, who  agrees with plan. All questions answered. Return precautions discussed and outpatient follow up given.    Gloriann Loan, PA-C 05/04/15 [place], MD 05/05/15 1204

## 2015-05-04 NOTE — ED Notes (Signed)
Patient given an urinal to use 

## 2015-05-04 NOTE — ED Notes (Signed)
Patient transported to CT 

## 2015-05-04 NOTE — Discharge Instructions (Signed)
Lipoma A lipoma is a noncancerous (benign) tumor that is made up of fat cells. This is a very common type of soft-tissue growth. Lipomas are usually found under the skin (subcutaneous). They may occur in any tissue of the body that contains fat. Common areas for lipomas to appear include the back, shoulders, buttocks, and thighs. Lipomas grow slowly, and they are usually painless. Most lipomas do not cause problems and do not require treatment. CAUSES The cause of this condition is not known. RISK FACTORS This condition is more likely to develop in:  People who are 67-56 years old.  People who have a family history of lipomas. SYMPTOMS A lipoma usually appears as a small, round bump under the skin. It may feel soft or rubbery, but the firmness can vary. Most lipomas are not painful. However, a lipoma may become painful if it is located in an area where it pushes on nerves. DIAGNOSIS A lipoma can usually be diagnosed with a physical exam. You may also have tests to confirm the diagnosis and to rule out other conditions. Tests may include:  Imaging tests, such as a CT scan or MRI.  Removal of a tissue sample to be looked at under a microscope (biopsy). TREATMENT Treatment is not needed for small lipomas that are not causing problems. If a lipoma continues to get bigger or it causes problems, removal is often the best option. Lipomas can also be removed to improve appearance. Removal of a lipoma is usually done with a surgery in which the fatty cells and the surrounding capsule are removed. Most often, a medicine that numbs the area (local anesthetic) is used for this procedure. HOME CARE INSTRUCTIONS  Keep all follow-up visits as directed by your health care provider. This is important. SEEK MEDICAL CARE IF:  Your lipoma becomes larger or hard.  Your lipoma becomes painful, red, or increasingly swollen. These could be signs of infection or a more serious condition.   This information is  not intended to replace advice given to you by your health care provider. Make sure you discuss any questions you have with your health care provider.   Document Released: 02/07/2002 Document Revised: 07/04/2014 Document Reviewed: 02/13/2014 Elsevier Interactive Patient Education 2016 [employer]  Musculoskeletal Pain Musculoskeletal pain is muscle and boney aches and pains. These pains can occur in any part of the body. Your caregiver may treat you without knowing the cause of the pain. They may treat you if blood or urine tests, X-rays, and other tests were normal.  CAUSES There is often not a definite cause or reason for these pains. These pains may be caused by a type of germ (virus). The discomfort may also come from overuse. Overuse includes working out too hard when your body is not fit. Boney aches also come from weather changes. Bone is sensitive to atmospheric pressure changes. HOME CARE INSTRUCTIONS   Ask when your test results will be ready. Make sure you get your test results.  Only take over-the-counter or prescription medicines for pain, discomfort, or fever as directed by your caregiver. If you were given medications for your condition, do not drive, operate machinery or power tools, or sign legal documents for 24 hours. Do not drink alcohol. Do not take sleeping pills or other medications that may interfere with treatment.  Continue all activities unless the activities cause more pain. When the pain lessens, slowly resume normal activities. Gradually increase the intensity and duration of the activities or exercise.  During periods  of severe pain, bed rest may be helpful. Lay or sit in any position that is comfortable.  Putting ice on the injured area.  Put ice in a bag.  Place a towel between your skin and the bag.  Leave the ice on for 15 to 20 minutes, 3 to 4 times a day.  Follow up with your caregiver for continued problems and no reason can be found for the pain.  If the pain becomes worse or does not go away, it may be necessary to repeat tests or do additional testing. Your caregiver may need to look further for a possible cause. SEEK IMMEDIATE MEDICAL CARE IF:  You have pain that is getting worse and is not relieved by medications.  You develop chest pain that is associated with shortness or breath, sweating, feeling sick to your stomach (nauseous), or throw up (vomit).  Your pain becomes localized to the abdomen.  You develop any new symptoms that seem different or that concern you. MAKE SURE YOU:   Understand these instructions.  Will watch your condition.  Will get help right away if you are not doing well or get worse.   This information is not intended to replace advice given to you by your health care provider. Make sure you discuss any questions you have with your health care provider.   Document Released: 02/17/2005 Document Revised: 05/12/2011 Document Reviewed: 10/22/2012 Elsevier Interactive Patient Education [employer].

## 2015-05-15 ENCOUNTER — Emergency Department (HOSPITAL_COMMUNITY)
Admission: EM | Admit: 2015-05-15 | Discharge: 2015-05-15 | Disposition: A | Discharge status: Home / Self Care | Payer: Medicare Other | Attending: Emergency Medicine | Admitting: Emergency Medicine

## 2015-05-15 ENCOUNTER — Encounter (HOSPITAL_COMMUNITY): Payer: Self-pay

## 2015-05-15 DIAGNOSIS — Y998 Other external cause status: Secondary | ICD-10-CM | POA: Diagnosis not present

## 2015-05-15 DIAGNOSIS — Z8507 Personal history of malignant neoplasm of pancreas: Secondary | ICD-10-CM | POA: Insufficient documentation

## 2015-05-15 DIAGNOSIS — Y9389 Activity, other specified: Secondary | ICD-10-CM | POA: Diagnosis not present

## 2015-05-15 DIAGNOSIS — Y9289 Other specified places as the place of occurrence of the external cause: Secondary | ICD-10-CM | POA: Diagnosis not present

## 2015-05-15 DIAGNOSIS — F17210 Nicotine dependence, cigarettes, uncomplicated: Secondary | ICD-10-CM | POA: Diagnosis not present

## 2015-05-15 DIAGNOSIS — R110 Nausea: Secondary | ICD-10-CM | POA: Insufficient documentation

## 2015-05-15 DIAGNOSIS — S6991XA Unspecified injury of right wrist, hand and finger(s), initial encounter: Secondary | ICD-10-CM | POA: Diagnosis present

## 2015-05-15 DIAGNOSIS — Z7982 Long term (current) use of aspirin: Secondary | ICD-10-CM | POA: Diagnosis not present

## 2015-05-15 DIAGNOSIS — S61511A Laceration without foreign body of right wrist, initial encounter: Secondary | ICD-10-CM | POA: Insufficient documentation

## 2015-05-15 DIAGNOSIS — I10 Essential (primary) hypertension: Secondary | ICD-10-CM | POA: Insufficient documentation

## 2015-05-15 DIAGNOSIS — Z79899 Other long term (current) drug therapy: Secondary | ICD-10-CM | POA: Diagnosis not present

## 2015-05-15 MED ORDER — LIDOCAINE-EPINEPHRINE 2 %-1:100000 IJ SOLN
INTRAMUSCULAR | Status: AC
  Administered 2015-05-15: 1 mL
  Filled 2015-05-15: qty 1

## 2015-05-15 MED ORDER — BACITRACIN ZINC 500 UNIT/GM EX OINT
TOPICAL_OINTMENT | Freq: Once | CUTANEOUS | Status: AC
  Administered 2015-05-15: 1 via TOPICAL
  Filled 2015-05-15: qty 1.8

## 2015-05-15 MED ORDER — LIDOCAINE-EPINEPHRINE (PF) 2 %-1:200000 IJ SOLN: 10.0000 mL | Freq: Once | INTRAMUSCULAR | Status: DC

## 2015-05-15 NOTE — Discharge Instructions (Signed)
Keep wound clean with mild soap and water. Keep area covered with a topical antibiotic ointment and bandage, keep bandage dry, and do not submerge in water for 24 hours. Ice and elevate for additional pain relief and swelling. Alternate between Ibuprofen and Tylenol for additional pain relief. Follow up with your primary care doctor or the [facility] [place] in approximately 7-10 days for wound recheck and suture removal. Monitor area for signs of infection to include, but not limited to: increasing pain, spreading redness, drainage/pus, worsening swelling, or fevers. Return to emergency department for emergent changing or worsening symptoms.    Laceration Care, Adult A laceration is a cut that goes through all layers of the skin. The cut also goes into the tissue that is right under the skin. Some cuts heal on their own. Others need to be closed with stitches (sutures), staples, skin adhesive strips, or wound glue. Taking care of your cut lowers your risk of infection and helps your cut to heal better. HOW TO TAKE CARE OF YOUR CUT For stitches or staples:  Keep the wound clean and dry.  If you were given a bandage (dressing), you should change it at least one time per day or as told by your doctor. You should also change it if it gets wet or dirty.  Keep the wound completely dry for the first 24 hours or as told by your doctor. After that time, you may take a shower or a bath. However, make sure that the wound is not soaked in water until after the stitches or staples have been removed.  Clean the wound one time each day or as told by your doctor:  Wash the wound with soap and water.  Rinse the wound with water until all of the soap comes off.  Pat the wound dry with a clean towel. Do not rub the wound.  After you clean the wound, put a thin layer of antibiotic ointment on it as told by your doctor. This ointment:  Helps to prevent infection.  Keeps the bandage from sticking to  the wound.  Have your stitches or staples removed as told by your doctor. If your doctor used skin adhesive strips:   Keep the wound clean and dry.  If you were given a bandage, you should change it at least one time per day or as told by your doctor. You should also change it if it gets dirty or wet.  Do not get the skin adhesive strips wet. You can take a shower or a bath, but be careful to keep the wound dry.  If the wound gets wet, pat it dry with a clean towel. Do not rub the wound.  Skin adhesive strips fall off on their own. You can trim the strips as the wound heals. Do not remove any strips that are still stuck to the wound. They will fall off after a while. If your doctor used wound glue:  Try to keep your wound dry, but you may briefly wet it in the shower or bath. Do not soak the wound in water, such as by swimming.  After you take a shower or a bath, gently pat the wound dry with a clean towel. Do not rub the wound.  Do not do any activities that will make you really sweaty until the skin glue has fallen off on its own.  Do not apply liquid, cream, or ointment medicine to your wound while the skin glue is still on.  If you were given a bandage, you should change it at least one time per day or as told by your doctor. You should also change it if it gets dirty or wet.  If a bandage is placed over the wound, do not let the tape for the bandage touch the skin glue.  Do not pick at the glue. The skin glue usually stays on for 5-10 days. Then, it falls off of the skin. General Instructions  To help prevent scarring, make sure to cover your wound with sunscreen whenever you are outside after stitches are removed, after adhesive strips are removed, or when wound glue stays in place and the wound is healed. Make sure to wear a sunscreen of at least 30 SPF.  Take over-the-counter and prescription medicines only as told by your doctor.  If you were given antibiotic medicine or  ointment, take or apply it as told by your doctor. Do not stop using the antibiotic even if your wound is getting better.  Do not scratch or pick at the wound.  Keep all follow-up visits as told by your doctor. This is important.  Check your wound every day for signs of infection. Watch for:  Redness, swelling, or pain.  Fluid, blood, or pus.  Raise (elevate) the injured area above the level of your heart while you are sitting or lying down, if possible. GET HELP IF:  You got a tetanus shot and you have any of these problems at the injection site:  Swelling.  Very bad pain.  Redness.  Bleeding.  You have a fever.  A wound that was closed breaks open.  You notice a bad smell coming from your wound or your bandage.  You notice something coming out of the wound, such as wood or glass.  Medicine does not help your pain.  You have more redness, swelling, or pain at the site of your wound.  You have fluid, blood, or pus coming from your wound.  You notice a change in the color of your skin near your wound.  You need to change the bandage often because fluid, blood, or pus is coming from the wound.  You start to have a new rash.  You start to have numbness around the wound. GET HELP RIGHT AWAY IF:  You have very bad swelling around the wound.  Your pain suddenly gets worse and is very bad.  You notice painful lumps near the wound or on skin that is anywhere on your body.  You have a red streak going away from your wound.  The wound is on your hand or foot and you cannot move a finger or toe like you usually can.  The wound is on your hand or foot and you notice that your fingers or toes look pale or bluish.   This information is not intended to replace advice given to you by your health care provider. Make sure you discuss any questions you have with your health care provider.   Document Released: 08/06/2007 Document Revised: 07/04/2014 Document Reviewed:  02/13/2014 Elsevier Interactive Patient Education 2016 [place], [place], or Adhesive Wound Closure Health care providers use stitches (sutures), staples, and certain glue (skin adhesives) to hold skin together while it heals (wound closure). You may need this treatment after you have surgery or if you cut your skin accidentally. These methods help your skin to heal more quickly and make it less likely that you will have a scar. A wound may take  several months to heal completely. The type of wound you have determines when your wound gets closed. In most cases, the wound is closed as soon as possible (primary skin closure). Sometimes, closure is delayed so the wound can be cleaned and allowed to heal naturally. This reduces the chance of infection. Delayed closure may be needed if your wound:  Is caused by a bite.  Happened more than 6 hours ago.  Involves loss of skin or the tissues under the skin.  Has dirt or debris in it that cannot be removed.  Is infected. WHAT ARE THE DIFFERENT KINDS OF WOUND CLOSURES? There are many options for wound closure. The one that your health care provider uses depends on how deep and how large your wound is. Adhesive Glue To use this type of glue to close a wound, your health care provider holds the edges of the wound together and paints the glue on the surface of your skin. You may need more than one layer of glue. Then the wound may be covered with a light bandage (dressing). This type of skin closure may be used for small wounds that are not deep (superficial). Using glue for wound closure is less painful than other methods. It does not require a medicine that numbs the area (local anesthetic). This method also leaves nothing to be removed. Adhesive glue is often used for children and on facial wounds. Adhesive glue cannot be used for wounds that are deep, uneven, or bleeding. It is not used inside of a wound.  Adhesive Strips These strips  are made of sticky (adhesive), porous paper. They are applied across your skin edges like a regular adhesive bandage. You leave them on until they fall off. Adhesive strips may be used to close very superficial wounds. They may also be used along with sutures to improve the closure of your skin edges.  Sutures Sutures are the oldest method of wound closure. Sutures can be made from natural substances, such as silk, or from synthetic materials, such as nylon and steel. They can be made from a material that your body can break down as your wound heals (absorbable), or they can be made from a material that needs to be removed from your skin (nonabsorbable). They come in many different strengths and sizes. Your health care provider attaches the sutures to a steel needle on one end. Sutures can be passed through your skin, or through the tissues beneath your skin. Then they are tied and cut. Your skin edges may be closed in one continuous stitch or in separate stitches. Sutures are strong and can be used for all kinds of wounds. Absorbable sutures may be used to close tissues under the skin. The disadvantage of sutures is that they may cause skin reactions that lead to infection. Nonabsorbable sutures need to be removed. Staples When surgical staples are used to close a wound, the edges of your skin on both sides of the wound are brought close together. A staple is placed across the wound, and an instrument secures the edges together. Staples are often used to close surgical cuts (incisions). Staples are faster to use than sutures, and they cause less skin reaction. Staples need to be removed using a tool that bends the staples away from your skin. HOW DO I CARE FOR MY WOUND CLOSURE?  Take medicines only as directed by your health care provider.  If you were prescribed an antibiotic medicine for your wound, finish it all even if you start  to feel better.  Use ointments or creams only as directed by your  health care provider.  Wash your hands with soap and water before and after touching your wound.  Do not soak your wound in water. Do not take baths, swim, or use a hot tub until your health care provider approves.  Ask your health care provider when you can start showering. Cover your wound if directed by your health care provider.  Do not take out your own sutures or staples.  Do not pick at your wound. Picking can cause an infection.  Keep all follow-up visits as directed by your health care provider. This is important. HOW LONG WILL I HAVE MY WOUND CLOSURE?  Leave adhesive glue on your skin until the glue peels away.  Leave adhesive strips on your skin until the strips fall off.  Absorbable sutures will dissolve within several days.  Nonabsorbable sutures and staples must be removed. The location of the wound will determine how long they stay in. This can range from several days to a couple of weeks. WHEN SHOULD I SEEK HELP FOR MY WOUND CLOSURE? Contact your health care provider if:  You have a fever.  You have chills.  You have drainage, redness, swelling, or pain at your wound.  There is a bad smell coming from your wound.  The skin edges of your wound start to separate after your sutures have been removed.  Your wound becomes thick, raised, and darker in color after your sutures come out (scarring).   This information is not intended to replace advice given to you by your health care provider. Make sure you discuss any questions you have with your health care provider.   Document Released: 11/12/2000 Document Revised: 03/10/2014 Document Reviewed: 07/27/2013 Elsevier Interactive Patient Education 2016 [place] Sutures are stitches that can be used to close wounds. Taking care of your wound properly can help prevent pain and infection. It can also help your wound to heal more quickly. HOW TO CARE FOR YOUR SUTURED WOUND Wound Care  Keep  the wound clean and dry.  If you were given a bandage (dressing), change it at least one time per day or as told by your doctor. You should also change it if it gets wet or dirty.  Keep the wound completely dry for the first 24 hours or as told by your doctor. After that time, you may shower or bathe. However, make sure that the wound is not soaked in water until the sutures have been removed.  Clean the wound one time each day or as told by your doctor.  Wash the wound with soap and water.  Rinse the wound with water to remove all soap.  Pat the wound dry with a clean towel. Do not rub the wound.  After cleaning the wound, put a thin layer of antibiotic ointment on it as told your doctor. This ointment:  Helps to prevent infection.  Keeps the bandage from sticking to the wound.  Have the sutures removed as told by your doctor. General Instructions  Take or apply medicines only as told by your doctor.  To help prevent scarring, make sure to cover your wound with sunscreen whenever you are outside after the sutures are removed and the wound is healed. Make sure to wear a sunscreen of at least 30 SPF.  If you were prescribed an antibiotic medicine or ointment, finish all of it even if you start to feel  better.  Do not scratch or pick at the wound.  Keep all follow-up visits as told by your doctor. This is important.  Check your wound every day for signs of infection. Watch for:  Redness, swelling, or pain.  Fluid, blood, or pus.  Raise (elevate) the injured area above the level of your heart while you are sitting or lying down, if possible.  Avoid stretching your wound.  Drink enough fluids to keep your pee (urine) clear or pale yellow. GET HELP IF:  You were given a tetanus shot and you have any of these where the needle went in:  Swelling.  Very bad pain.  Redness.  Bleeding.  You have a fever.  A wound that was closed breaks open.  You notice a bad smell  coming from the wound.  You notice something coming out of the wound, such as wood or glass.  Medicine does not help your pain.  You have any of these at the site of the wound.  More redness.  More swelling.  More pain.  You have any of these coming from the wound.  Fluid.  Blood.  Pus.  You notice a change in the color of your skin near the wound.  You need to change the bandage often due to fluid, blood, or pus coming from the wound.  You have a new rash.  You have numbness around the wound. GET HELP RIGHT AWAY IF:  You have very bad swelling around the wound.  Your pain suddenly gets worse and is very bad.  You have painful lumps near the wound or on skin that is anywhere on your body.  You have a red streak going away from the wound.  The wound is on your hand or foot and you cannot move a finger or toe like normal.  The wound is on your hand or foot and you notice that your fingers or toes look pale or bluish.   This information is not intended to replace advice given to you by your health care provider. Make sure you discuss any questions you have with your health care provider.   Document Released: 08/06/2007 Document Revised: 07/04/2014 Document Reviewed: 09/29/2012 Elsevier Interactive Patient Education [employer].
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
## 2015-05-25 ENCOUNTER — Encounter (HOSPITAL_COMMUNITY): Payer: Self-pay

## 2015-05-25 ENCOUNTER — Emergency Department (HOSPITAL_COMMUNITY): Payer: No Typology Code available for payment source

## 2015-05-25 ENCOUNTER — Emergency Department (HOSPITAL_COMMUNITY)
Admission: EM | Admit: 2015-05-25 | Discharge: 2015-05-25 | Disposition: A | Discharge status: Home / Self Care | Payer: No Typology Code available for payment source | Attending: Physician Assistant | Admitting: Physician Assistant

## 2015-05-25 DIAGNOSIS — S199XXA Unspecified injury of neck, initial encounter: Secondary | ICD-10-CM | POA: Insufficient documentation

## 2015-05-25 DIAGNOSIS — Y998 Other external cause status: Secondary | ICD-10-CM | POA: Diagnosis not present

## 2015-05-25 DIAGNOSIS — S79911A Unspecified injury of right hip, initial encounter: Secondary | ICD-10-CM | POA: Insufficient documentation

## 2015-05-25 DIAGNOSIS — I10 Essential (primary) hypertension: Secondary | ICD-10-CM | POA: Diagnosis not present

## 2015-05-25 DIAGNOSIS — Z8719 Personal history of other diseases of the digestive system: Secondary | ICD-10-CM | POA: Diagnosis not present

## 2015-05-25 DIAGNOSIS — F17210 Nicotine dependence, cigarettes, uncomplicated: Secondary | ICD-10-CM | POA: Insufficient documentation

## 2015-05-25 DIAGNOSIS — Z79899 Other long term (current) drug therapy: Secondary | ICD-10-CM | POA: Diagnosis not present

## 2015-05-25 DIAGNOSIS — F419 Anxiety disorder, unspecified: Secondary | ICD-10-CM | POA: Insufficient documentation

## 2015-05-25 DIAGNOSIS — Y92410 Unspecified street and highway as the place of occurrence of the external cause: Secondary | ICD-10-CM | POA: Insufficient documentation

## 2015-05-25 DIAGNOSIS — Y9389 Activity, other specified: Secondary | ICD-10-CM | POA: Insufficient documentation

## 2015-05-25 DIAGNOSIS — IMO0002 Reserved for concepts with insufficient information to code with codable children: Secondary | ICD-10-CM

## 2015-05-25 DIAGNOSIS — S3992XA Unspecified injury of lower back, initial encounter: Secondary | ICD-10-CM | POA: Insufficient documentation

## 2015-05-25 MED ORDER — OXYCODONE-ACETAMINOPHEN 5-325 MG PO TABS
1.0000 | ORAL_TABLET | Freq: Once | ORAL | Status: AC
  Administered 2015-05-25: 1 via ORAL
  Filled 2015-05-25: qty 1

## 2015-05-25 MED ORDER — ONDANSETRON 4 MG PO TBDP
8.0000 mg | ORAL_TABLET | Freq: Once | ORAL | Status: AC
  Administered 2015-05-25: 8 mg via ORAL
  Filled 2015-05-25 (×2): qty 2
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
## 2015-05-25 NOTE — ED Notes (Signed)
Went into medicate pt. And given him a percocet.  He did not want me to  Lift his head up or move him.  He stated, "Just give me the pill like this."   Gave him the percocet , pt. Swallowed the medication without any difficulty.  Pt. Also given Zofran the same way.
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
## 2015-05-27 ENCOUNTER — Encounter (HOSPITAL_COMMUNITY): Payer: Self-pay | Admitting: Emergency Medicine

## 2015-05-27 ENCOUNTER — Emergency Department (HOSPITAL_COMMUNITY)
Admission: EM | Admit: 2015-05-27 | Discharge: 2015-05-27 | Disposition: A | Discharge status: Home / Self Care | Payer: Medicare Other | Attending: Emergency Medicine | Admitting: Emergency Medicine

## 2015-05-27 DIAGNOSIS — F17210 Nicotine dependence, cigarettes, uncomplicated: Secondary | ICD-10-CM | POA: Insufficient documentation

## 2015-05-27 DIAGNOSIS — M545 Low back pain: Secondary | ICD-10-CM | POA: Insufficient documentation

## 2015-05-27 DIAGNOSIS — Z7952 Long term (current) use of systemic steroids: Secondary | ICD-10-CM | POA: Insufficient documentation

## 2015-05-27 DIAGNOSIS — I10 Essential (primary) hypertension: Secondary | ICD-10-CM | POA: Insufficient documentation

## 2015-05-27 DIAGNOSIS — Z8507 Personal history of malignant neoplasm of pancreas: Secondary | ICD-10-CM | POA: Diagnosis not present

## 2015-05-27 DIAGNOSIS — R269 Unspecified abnormalities of gait and mobility: Secondary | ICD-10-CM | POA: Insufficient documentation

## 2015-05-27 DIAGNOSIS — R202 Paresthesia of skin: Secondary | ICD-10-CM | POA: Insufficient documentation

## 2015-05-27 DIAGNOSIS — Z79899 Other long term (current) drug therapy: Secondary | ICD-10-CM | POA: Insufficient documentation

## 2015-05-27 MED ORDER — METHOCARBAMOL 500 MG PO TABS
500.0000 mg | ORAL_TABLET | Freq: Two times a day (BID) | ORAL | Status: DC | PRN
Start: 2015-05-27 — End: 2015-08-25

## 2015-05-27 MED ORDER — NAPROXEN 500 MG PO TABS
250.0000 mg | ORAL_TABLET | Freq: Once | ORAL | Status: AC
  Administered 2015-05-27: 250 mg via ORAL
  Filled 2015-05-27: qty 1

## 2015-05-27 MED ORDER — NAPROXEN 250 MG PO TABS: 250.0000 mg | ORAL_TABLET | Freq: Two times a day (BID) | ORAL | Status: DC
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
## 2015-05-27 NOTE — Discharge Instructions (Signed)
Back Pain, Adult °Back pain is very common in adults. The cause of back pain is rarely dangerous and the pain often gets better over time. The cause of your back pain may not be known. Some common causes of back pain include: °1. Strain of the muscles or ligaments supporting the spine. °2. Wear and tear (degeneration) of the spinal disks. °3. Arthritis. °4. Direct injury to the back. °For many people, back pain may return. Since back pain is rarely dangerous, most people can learn to manage this condition on their own. °HOME CARE INSTRUCTIONS °Watch your back pain for any changes. The following actions may help to lessen any discomfort you are feeling: °1. Remain active. It is stressful on your back to sit or stand in one place for long periods of time. Do not sit, drive, or stand in one place for more than 30 minutes at a time. Take short walks on even surfaces as soon as you are able. Try to increase the length of time you walk each day. °2. Exercise regularly as directed by your health care provider. Exercise helps your back heal faster. It also helps avoid future injury by keeping your muscles strong and flexible. °3. Do not stay in bed. Resting more than 1-2 days can delay your recovery. °4. Pay attention to your body when you bend and lift. The most comfortable positions are those that put less stress on your recovering back. Always use proper lifting techniques, including: °1. Bending your knees. °2. Keeping the load close to your body. °3. Avoiding twisting. °5. Find a comfortable position to sleep. Use a firm mattress and lie on your side with your knees slightly bent. If you lie on your back, put a pillow under your knees. °6. Avoid feeling anxious or stressed. Stress increases muscle tension and can worsen back pain. It is important to recognize when you are anxious or stressed and learn ways to manage it, such as with exercise. °7. Take medicines only as directed by your health care provider.  Over-the-counter medicines to reduce pain and inflammation are often the most helpful. Your health care provider may prescribe muscle relaxant drugs. These medicines help dull your pain so you can more quickly return to your normal activities and healthy exercise. °8. Apply ice to the injured area: °1. Put ice in a plastic bag. °2. Place a towel between your skin and the bag. °3. Leave the ice on for 20 minutes, 2-3 times a day for the first 2-3 days. After that, ice and heat may be alternated to reduce pain and spasms. °9. Maintain a healthy weight. Excess weight puts extra stress on your back and makes it difficult to maintain good posture. °SEEK MEDICAL CARE IF: °1. You have pain that is not relieved with rest or medicine. °2. You have increasing pain going down into the legs or buttocks. °3. You have pain that does not improve in one week. °4. You have night pain. °5. You lose weight. °6. You have a fever or chills. °SEEK IMMEDIATE MEDICAL CARE IF:  °1. You develop new bowel or bladder control problems. °2. You have unusual weakness or numbness in your arms or legs. °3. You develop nausea or vomiting. °4. You develop abdominal pain. °5. You feel faint. °  °This information is not intended to replace advice given to you by your health care provider. Make sure you discuss any questions you have with your health care provider. °  °Document Released: 02/17/2005 Document Revised: 03/10/2014 Document Reviewed: 06/21/2013 °Elsevier Interactive Patient Education ©2016 [employer] ° °Back Exercises °The following exercises strengthen the muscles that help to support the back. They also help to keep the lower back flexible. Doing these exercises can help to prevent back pain or lessen existing pain. °If you have back pain or discomfort, try doing these exercises 2-3 times each day or as told by your health care provider. When the pain goes away, do them once each day, but increase the number of times that you repeat the  steps for each exercise (do more repetitions). If you do not have back pain or discomfort, do these exercises once each day or as told by your health care provider. °EXERCISES °Single Knee to Chest °Repeat these steps 3-5 times for each leg: °5. Lie on your back on a firm bed or the floor with your legs extended. °6. Bring one knee to your chest. Your other leg should stay extended and in contact with the floor. °7. Hold your knee in place by grabbing your knee or thigh. °8. Pull on your knee until you feel a gentle stretch in your lower back. °9. Hold the stretch for 10-30 seconds. °10. Slowly release and straighten your leg. °Pelvic Tilt °Repeat these steps 5-10 times: °10. Lie on your back on a firm bed or the floor with your legs extended. °11. Bend your knees so they are pointing toward the ceiling and your feet are flat on the floor. °12. Tighten your lower abdominal muscles to press your lower back against the floor. This motion will tilt your pelvis so your tailbone points up toward the ceiling instead of pointing to your feet or the floor. °13. With gentle tension and even breathing, hold this position for 5-10 seconds. °Cat-Cow °Repeat these steps until your lower back becomes more flexible: °7. Get into a hands-and-knees position on a firm surface. Keep your hands under your shoulders, and keep your knees under your hips. You may place padding under your knees for comfort. °8. Let your head hang down, and point your tailbone toward the floor so your lower back becomes rounded like the back of a cat. °9. Hold this position for 5 seconds. °10. Slowly lift your head and point your tailbone up toward the ceiling so your back forms a sagging arch like the back of a cow. °11. Hold this position for 5 seconds. °Press-Ups °Repeat these steps 5-10 times: °6. Lie on your abdomen (face-down) on the floor. °7. Place your palms near your head, about shoulder-width apart. °8. While you keep your back as relaxed as  possible and keep your hips on the floor, slowly straighten your arms to raise the top half of your body and lift your shoulders. Do not use your back muscles to raise your upper torso. You may adjust the placement of your hands to make yourself more comfortable. °9. Hold this position for 5 seconds while you keep your back relaxed. °10. Slowly return to lying flat on the floor. °Bridges °Repeat these steps 10 times: °1. Lie on your back on a firm surface. °2. Bend your knees so they are pointing toward the ceiling and your feet are flat on the floor. °3. Tighten your buttocks muscles and lift your buttocks off of the floor until your waist is at almost the same height as your knees. You should feel the muscles working in your buttocks and the back of your thighs. If you do not feel these muscles, slide your feet 1-2 inches farther away from your buttocks. °4. Hold this   position for 3-5 seconds. °5. Slowly lower your hips to the starting position, and allow your buttocks muscles to relax completely. °If this exercise is too easy, try doing it with your arms crossed over your chest. °Abdominal Crunches °Repeat these steps 5-10 times: °1. Lie on your back on a firm bed or the floor with your legs extended. °2. Bend your knees so they are pointing toward the ceiling and your feet are flat on the floor. °3. Cross your arms over your chest. °4. Tip your chin slightly toward your chest without bending your neck. °5. Tighten your abdominal muscles and slowly raise your trunk (torso) high enough to lift your shoulder blades a tiny bit off of the floor. Avoid raising your torso higher than that, because it can put too much stress on your low back and it does not help to strengthen your abdominal muscles. °6. Slowly return to your starting position. °Back Lifts °Repeat these steps 5-10 times: °1. Lie on your abdomen (face-down) with your arms at your sides, and rest your forehead on the floor. °2. Tighten the muscles in your  legs and your buttocks. °3. Slowly lift your chest off of the floor while you keep your hips pressed to the floor. Keep the back of your head in line with the curve in your back. Your eyes should be looking at the floor. °4. Hold this position for 3-5 seconds. °5. Slowly return to your starting position. °SEEK MEDICAL CARE IF: °· Your back pain or discomfort gets much worse when you do an exercise. °· Your back pain or discomfort does not lessen within 2 hours after you exercise. °If you have any of these problems, stop doing these exercises right away. Do not do them again unless your health care provider says that you can. °SEEK IMMEDIATE MEDICAL CARE IF: °· You develop sudden, severe back pain. If this happens, stop doing the exercises right away. Do not do them again unless your health care provider says that you can. °  °This information is not intended to replace advice given to you by your health care provider. Make sure you discuss any questions you have with your health care provider. °  °Document Released: 03/27/2004 Document Revised: 11/08/2014 Document Reviewed: 04/13/2014 °Elsevier Interactive Patient Education ©2016 [employer] ° °

## 2015-05-28 ENCOUNTER — Encounter (HOSPITAL_COMMUNITY): Payer: Self-pay

## 2015-08-06 ENCOUNTER — Encounter: Payer: Self-pay | Admitting: *Deleted

## 2015-08-07 ENCOUNTER — Ambulatory Visit (INDEPENDENT_AMBULATORY_CARE_PROVIDER_SITE_OTHER): Payer: Medicare Other | Admitting: Diagnostic Neuroimaging

## 2015-08-07 ENCOUNTER — Encounter: Payer: Self-pay | Admitting: Diagnostic Neuroimaging

## 2015-08-07 VITALS — BP 127/93 | HR 90 | Ht 73.0 in | Wt 202.4 lb

## 2015-08-07 DIAGNOSIS — M5416 Radiculopathy, lumbar region: Secondary | ICD-10-CM | POA: Diagnosis not present

## 2015-08-07 DIAGNOSIS — M5412 Radiculopathy, cervical region: Secondary | ICD-10-CM

## 2015-08-07 DIAGNOSIS — G894 Chronic pain syndrome: Secondary | ICD-10-CM

## 2015-08-07 NOTE — Patient Instructions (Signed)
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
## 2015-08-14 ENCOUNTER — Emergency Department (HOSPITAL_COMMUNITY)
Admission: EM | Admit: 2015-08-14 | Discharge: 2015-08-14 | Disposition: A | Discharge status: Home / Self Care | Payer: Medicare Other | Attending: Emergency Medicine | Admitting: Emergency Medicine

## 2015-08-14 ENCOUNTER — Encounter (HOSPITAL_COMMUNITY): Payer: Self-pay

## 2015-08-14 DIAGNOSIS — Z791 Long term (current) use of non-steroidal anti-inflammatories (NSAID): Secondary | ICD-10-CM | POA: Diagnosis not present

## 2015-08-14 DIAGNOSIS — H1132 Conjunctival hemorrhage, left eye: Secondary | ICD-10-CM | POA: Diagnosis not present

## 2015-08-14 DIAGNOSIS — Z8507 Personal history of malignant neoplasm of pancreas: Secondary | ICD-10-CM | POA: Insufficient documentation

## 2015-08-14 DIAGNOSIS — Z79891 Long term (current) use of opiate analgesic: Secondary | ICD-10-CM | POA: Insufficient documentation

## 2015-08-14 DIAGNOSIS — Z79899 Other long term (current) drug therapy: Secondary | ICD-10-CM | POA: Diagnosis not present

## 2015-08-14 DIAGNOSIS — F17210 Nicotine dependence, cigarettes, uncomplicated: Secondary | ICD-10-CM | POA: Insufficient documentation

## 2015-08-14 DIAGNOSIS — I10 Essential (primary) hypertension: Secondary | ICD-10-CM | POA: Diagnosis not present

## 2015-08-14 DIAGNOSIS — H578 Other specified disorders of eye and adnexa: Secondary | ICD-10-CM | POA: Diagnosis present

## 2015-08-14 MED ORDER — FLUORESCEIN SODIUM 1 MG OP STRP
1.0000 | ORAL_STRIP | Freq: Once | OPHTHALMIC | Status: AC
  Administered 2015-08-14: 1 via OPHTHALMIC
  Filled 2015-08-14: qty 1

## 2015-08-14 MED ORDER — TETRACAINE HCL 0.5 % OP SOLN
1.0000 [drp] | Freq: Once | OPHTHALMIC | Status: AC
  Administered 2015-08-14: 1 [drp] via OPHTHALMIC
  Filled 2015-08-14: qty 2

## 2015-08-14 NOTE — ED Notes (Signed)
Patient here with left eye redness x 2 days. denies injury, denies pain, denies blurry vision.

## 2015-08-14 NOTE — ED Notes (Signed)
Pt verbalized understanding of d/c instructions and has no further questions. Pt stable, ambulatory and NAD. Pt informed to follow up with Dr Katy Fitch if sx are not improving over the next 2 days.

## 2015-08-14 NOTE — Discharge Instructions (Signed)
Subconjunctival Hemorrhage °Subconjunctival hemorrhage is bleeding that happens between the white part of your eye (sclera) and the clear membrane that covers the outside of your eye (conjunctiva). There are many tiny blood vessels near the surface of your eye. A subconjunctival hemorrhage happens when one or more of these vessels breaks and bleeds, causing a red patch to appear on your eye. This is similar to a bruise. °Depending on the amount of bleeding, the red patch may only cover a small area of your eye or it may cover the entire visible part of the sclera. If a lot of blood collects under the conjunctiva, there may also be swelling. Subconjunctival hemorrhages do not affect your vision or cause pain, but your eye may feel irritated if there is swelling. Subconjunctival hemorrhages usually do not require treatment, and they disappear on their own within two weeks. °CAUSES °This condition may be caused by: °· Mild trauma, such as rubbing your eye too hard. °· Severe trauma or blunt injuries. °· Coughing, sneezing, or vomiting. °· Straining, such as when lifting a heavy object. °· High blood pressure. °· Recent eye surgery. °· A history of diabetes. °· Certain medicines, especially blood thinners (anticoagulants). °· Other conditions, such as eye tumors, bleeding disorders, or blood vessel abnormalities. °Subconjunctival hemorrhages can happen without an obvious cause.  °SYMPTOMS  °Symptoms of this condition include: °· A bright red or dark red patch on the white part of the eye. °¨ The red area may spread out to cover a larger area of the eye before it goes away. °¨ The red area may turn brownish-yellow before it goes away. °· Swelling. °· Mild eye irritation. °DIAGNOSIS °This condition is diagnosed with a physical exam. If your subconjunctival hemorrhage was caused by trauma, your health care provider may refer you to an eye specialist (ophthalmologist) or another specialist to check for other injuries. You  may have other tests, including: °· An eye exam. °· A blood pressure check. °· Blood tests to check for bleeding disorders. °If your subconjunctival hemorrhage was caused by trauma, X-rays or a CT scan may be done to check for other injuries. °TREATMENT °Usually, no treatment is needed. Your health care provider may recommend eye drops or cold compresses to help with discomfort. °HOME CARE INSTRUCTIONS °· Take over-the-counter and prescription medicines only as directed by your health care provider. °· Use eye drops or cold compresses to help with discomfort as directed by your health care provider. °· Avoid activities, things, and environments that may irritate or injure your eye. °· Keep all follow-up visits as told by your health care provider. This is important. °SEEK MEDICAL CARE IF: °· You have pain in your eye. °· The bleeding does not go away within 3 weeks. °· You keep getting new subconjunctival hemorrhages. °SEEK IMMEDIATE MEDICAL CARE IF: °· Your vision changes or you have difficulty seeing. °· You suddenly develop severe sensitivity to light. °· You develop a severe headache, persistent vomiting, confusion, or abnormal tiredness (lethargy). °· Your eye seems to bulge or protrude from your eye socket. °· You develop unexplained bruises on your body. °· You have unexplained bleeding in another area of your body. °  °This information is not intended to replace advice given to you by your health care provider. Make sure you discuss any questions you have with your health care provider. °  °Document Released: 02/17/2005 Document Revised: 11/08/2014 Document Reviewed: 04/26/2014 °Elsevier Interactive Patient Education ©2016 [employer] Inc[employer] ° °

## 2015-08-14 NOTE — ED Notes (Addendum)
Pt started last pm with subconjunctival hemorrhage to left eye, onset last pm. No known injury. No visual disturbances.

## 2015-08-14 NOTE — ED Provider Notes (Signed)
CSN: [id number]     Arrival date & time 08/14/15  1307 History  By signing my name below, I, Fernando Diaz, attest that this documentation has been prepared under the direction and in the presence of non-physician practitioner, Cheri Fowler, PA-C. Electronically Signed: Marisue Diaz, Scribe. 08/14/2015. 3:06 PM.   Chief Complaint  Patient presents with  . eye redness     The history is provided by the patient. No language interpreter was used.   HPI Comments:  Fernando Diaz is a 50 y.o. male with PMHx of HTN and pancreatic cancer who presents to the Emergency Department complaining of gradually worsening left eye redness onset last night. Pt reports associated mild blurry vision. Pt does not wear contacts; he wears reading glasses as needed. No alleviating or exacerbating factors noted. Denies injury to eye, eye pain, drainage from eye, vomiting, trauma to face, use of anticoagulants, fever, HA, numbness, or weakness.  Past Medical History  Diagnosis Date  . Bleeding ulcer   . Personality disorder   . Mood disorder ([facility])   . Anxiety disorder   . Pancreatic cancer ([facility])   . Hypertension   . Heart murmur     "slight"   Past Surgical History  Procedure Laterality Date  . Tonsillectomy      childhood  . Cervical fusion  2006, 2009    x 2  . Back surgery  2011    lower back   Family History  Problem Relation Age of Onset  . Hypertension Father   . Depression Brother   . Cancer Mother    Social History  Substance Use Topics  . Smoking status: Current Every Day Smoker -- 0.10 packs/day    Types: Cigarettes  . Smokeless tobacco: Never Used     Comment: decrease in daily smoking , 08/07/15 3 cigs/day  . Alcohol Use: No     Comment:  history of past alcohol use  goes to NA     Review of Systems  Eyes: Positive for redness and visual disturbance. Negative for pain and discharge.  Gastrointestinal: Negative for vomiting.  Neurological: Negative for headaches.     Allergies  Celebrex ; Fentanyl; Morphine and related; Morphine and related; Naproxen; Other; and Ibuprofen  Home Medications   Prior to Admission medications   Medication Sig Start Date End Date Taking? Authorizing Provider  albuterol (PROVENTIL HFA;VENTOLIN HFA) 108 (90 BASE) MCG/ACT inhaler Inhale 2 puffs into the lungs every 6 (six) hours as needed for wheezing or shortness of breath. Reported on 08/07/2015    Historical Provider, MD  albuterol (PROVENTIL) (2.5 MG/3ML) 0.083% nebulizer solution Take 2.5 mg by nebulization every 6 (six) hours as needed for wheezing or shortness of breath. Reported on 08/07/2015    Historical Provider, MD  buPROPion (WELLBUTRIN XL) 150 MG 24 hr tablet 150 mg daily. 08/06/15   Historical Provider, MD  citalopram (CELEXA) 20 MG tablet Take 20 mg by mouth daily. Reported on 08/07/2015    Historical Provider, MD  lisinopril-hydrochlorothiazide (PRINZIDE,ZESTORETIC) 20-12.5 MG per tablet Take 1 tablet by mouth daily.    Historical Provider, MD  meloxicam (MOBIC) 7.5 MG tablet Take 7.5 mg by mouth daily as needed for pain. Reported on 08/07/2015 11/27/14   Historical Provider, MD  methocarbamol (ROBAXIN) 500 MG tablet Take 1 tablet (500 mg total) by mouth 2 (two) times daily as needed for muscle spasms. 05/27/15   Everlene Farrier, PA-C  metoCLOPramide (REGLAN) 5 MG tablet Reported on 08/07/2015 07/18/15   Historical  Provider, MD  naproxen (NAPROSYN) 250 MG tablet Take 1 tablet (250 mg total) by mouth 2 (two) times daily with a meal. 05/27/15   Everlene Farrier, PA-C  omeprazole (PRILOSEC) 20 MG capsule Take 20 mg by mouth daily. 11/27/14   Historical Provider, MD  oxyCODONE-acetaminophen (PERCOCET) 10-325 MG tablet Take 1 tablet by mouth every 4 (four) hours as needed for pain.    Historical Provider, MD  promethazine (PHENERGAN) 12.5 MG tablet Reported on 08/07/2015 07/18/15   Historical Provider, MD  SUPREP BOWEL PREP KIT 17.5-3.13-1.6 GM/180ML SOLN Reported on 08/07/2015 07/18/15    Historical Provider, MD  traZODone (DESYREL) 50 MG tablet Take 50 mg by mouth at bedtime as needed for sleep. Reported on 08/07/2015    Historical Provider, MD   BP 112/62 mmHg  Pulse 86  Temp(Src) 98.3 F (36.8 C) (Oral)  Resp 18  Ht 6\' 1"  (1.854 m)  Wt 92.987 kg  BMI 27.05 kg/m2  SpO2 99%   Physical Exam  Constitutional: He is oriented to person, place, and time. He appears well-developed and well-nourished.  HENT:  Head: Normocephalic and atraumatic.  Right Ear: External ear normal.  Left Ear: External ear normal.  Eyes: EOM and lids are normal. Pupils are equal, round, and reactive to light. Lids are everted and swept, no foreign bodies found. Right eye exhibits no chemosis, no discharge, no exudate and no hordeolum. No foreign body present in the right eye. Left eye exhibits no chemosis, no discharge, no exudate and no hordeolum. No foreign body present in the left eye. Right conjunctiva is not injected. Right conjunctiva has no hemorrhage. Left conjunctiva is not injected. Left conjunctiva has a hemorrhage. No scleral icterus.  Slit lamp exam:      The right eye shows no corneal abrasion, no foreign body, no hyphema and no hypopyon.       The left eye shows no corneal abrasion, no foreign body, no hyphema and no hypopyon.    Subconjunctival hemorrhage on medial aspect of left eye. No hyphema.     Visual Acuity  Right Eye Distance: 20/25 without corrective lens Left Eye Distance: 20/25 without corrective lens Bilateral Distance: 20/25 without corrective lens      Neck: No tracheal deviation present.  Pulmonary/Chest: Effort normal. No respiratory distress.  Abdominal: He exhibits no distension.  Musculoskeletal: Normal range of motion.  Neurological: He is alert and oriented to person, place, and time.  Skin: Skin is warm and dry.  Psychiatric: He has a normal mood and affect. His behavior is normal.    ED Course  Procedures  DIAGNOSTIC STUDIES:  Oxygen Saturation  is 98% on RA, normal by my interpretation.    COORDINATION OF CARE:  3:01 PM Discussed treatment plan with pt at bedside and pt agreed to plan.  Labs Review Labs Reviewed - No data to display  Imaging Review No results found. I have personally reviewed and evaluated these images and lab results as part of my medical decision-making.   EKG Interpretation None      MDM   Final diagnoses:  Subconjunctival hemorrhage, left   Patient presents for evaluation of left eye redness.  No pain, drainage, injury/trauma, HA, fever.  On exam, patient appears well.  PERRL, left subconjunctival hemorrhage.  No hyphema.  Vision not affected, 20/25 bilaterally.  Doubt corneal abrasion.  Doubt intracranial etiology.  No indication for abx at this time.  Follow up ophthalmology.  Discussed return precautions.  Patient agrees and acknowledges the above  plan for discharge.   I personally performed the services described in this documentation, which was scribed in my presence. The recorded information has been reviewed and is accurate.    Gloriann Loan, PA-C 08/14/15 1517  Sherwood Gambler, MD 08/17/15 [phone]

## 2015-08-24 ENCOUNTER — Encounter (HOSPITAL_COMMUNITY): Payer: Self-pay | Admitting: *Deleted

## 2015-08-24 ENCOUNTER — Emergency Department (HOSPITAL_COMMUNITY)
Admission: EM | Admit: 2015-08-24 | Discharge: 2015-08-24 | Discharge status: Against Medical Advice | Payer: Medicare Other | Attending: Emergency Medicine | Admitting: Emergency Medicine

## 2015-08-24 DIAGNOSIS — J020 Streptococcal pharyngitis: Secondary | ICD-10-CM | POA: Insufficient documentation

## 2015-08-24 DIAGNOSIS — Z79899 Other long term (current) drug therapy: Secondary | ICD-10-CM | POA: Insufficient documentation

## 2015-08-24 DIAGNOSIS — F17210 Nicotine dependence, cigarettes, uncomplicated: Secondary | ICD-10-CM | POA: Diagnosis not present

## 2015-08-24 DIAGNOSIS — I10 Essential (primary) hypertension: Secondary | ICD-10-CM | POA: Insufficient documentation

## 2015-08-24 DIAGNOSIS — R590 Localized enlarged lymph nodes: Secondary | ICD-10-CM | POA: Insufficient documentation

## 2015-08-24 DIAGNOSIS — J029 Acute pharyngitis, unspecified: Secondary | ICD-10-CM | POA: Diagnosis present

## 2015-08-24 DIAGNOSIS — Z8507 Personal history of malignant neoplasm of pancreas: Secondary | ICD-10-CM | POA: Diagnosis not present

## 2015-08-24 LAB — RAPID STREP SCREEN (MED CTR MEBANE ONLY)
STREPTOCOCCUS, GROUP A SCREEN (DIRECT): POSITIVE — AB
Streptococcus, Group A Screen (Direct): NEGATIVE

## 2015-08-24 NOTE — ED Notes (Signed)
Pt reports having sore throat that started last night, extremely painful to swallow. Denies fever. Pain is radiating from right side of neck and around to posterior neck. Airway intact at triage.

## 2015-08-24 NOTE — ED Notes (Addendum)
Call bell went off, and this RN answered.  I opened the door and asked pt if I could do anything for him.  Pt became agitated and stated he was tired of waiting.  "I have been here since 3pm.  What the hell is going on?".  This RN explained that we were waiting on the results from his second strep test, and pt stated he was under the impression that the PA was going to be ordering him ABX.  This RN asked if that is why pt hit the call bell, and pt yelled at RN stating he did not.   As I was waiting for the PA to come out of another room so I could ask her about the POC, the pt told the pharmacy tech that he was going to leave.  This RN attempted to wait for PA to come out, and pt got dressed and told pharmacy tech again in hall that he was leaving.  Did not want to speak to this RN or PA.  Made PA aware.

## 2015-08-24 NOTE — ED Provider Notes (Signed)
CSN: [id number]     Arrival date & time 08/24/15  1648 History   First MD Initiated Contact with Patient 08/24/15 2026     Chief Complaint  Patient presents with  . Sore Throat     (Consider location/radiation/quality/duration/timing/severity/associated sxs/prior Treatment) HPI    Patient to the ER with PMH (per chart review) of bleeding ulcer, personality disorder, Mood disorder, pancreatic cancer, hypertension, heart murmur and tonsillectomy as well as cervical fusion in 2006 and 2009 comes to the ER with complaints of a severe sore throat. His pain started last light and he is having significant pain with swallowing. He has not had any fever. He describes the pain as sharp. He has tried gargling salt water. He feels like his symptoms had been improving but are now worsening again. The pain is mainly to the right side of this throat. NO spitting, drooling, difficulty swallowing (although it is painful), fevers, diarrhea, LE swelling.     Past Medical History  Diagnosis Date  . Bleeding ulcer   . Personality disorder   . Mood disorder (HCC)   . Anxiety disorder   . Pancreatic cancer (HCC)   . Hypertension   . Heart murmur     "slight"   Past Surgical History  Procedure Laterality Date  . Tonsillectomy      childhood  . Cervical fusion  2006, 2009    x 2  . Back surgery  2011    lower back   Family History  Problem Relation Age of Onset  . Hypertension Father   . Depression Brother   . Cancer Mother    Social History  Substance Use Topics  . Smoking status: Current Every Day Smoker -- 0.10 packs/day    Types: Cigarettes  . Smokeless tobacco: Never Used     Comment: decrease in daily smoking , 08/07/15 3 cigs/day  . Alcohol Use: No     Comment:  history of past alcohol use  goes to NA     Review of Systems . Review of Systems All other systems negative except as documented in the HPI. All pertinent positives and negatives as reviewed in the HPI.  Allergies   Celebrex ; Fentanyl; Morphine and related; Morphine and related; Naproxen; Other; and Ibuprofen  Home Medications   Prior to Admission medications   Medication Sig Start Date End Date Taking? Authorizing Provider  albuterol (PROVENTIL HFA;VENTOLIN HFA) 108 (90 BASE) MCG/ACT inhaler Inhale 2 puffs into the lungs every 6 (six) hours as needed for wheezing or shortness of breath. Reported on 08/07/2015    Historical Provider, MD  albuterol (PROVENTIL) (2.5 MG/3ML) 0.083% nebulizer solution Take 2.5 mg by nebulization every 6 (six) hours as needed for wheezing or shortness of breath. Reported on 08/07/2015    Historical Provider, MD  buPROPion (WELLBUTRIN XL) 150 MG 24 hr tablet 150 mg daily. 08/06/15   Historical Provider, MD  citalopram (CELEXA) 20 MG tablet Take 20 mg by mouth daily. Reported on 08/07/2015    Historical Provider, MD  lisinopril-hydrochlorothiazide (PRINZIDE,ZESTORETIC) 20-12.5 MG per tablet Take 1 tablet by mouth daily.    Historical Provider, MD  meloxicam (MOBIC) 7.5 MG tablet Take 7.5 mg by mouth daily as needed for pain. Reported on 08/07/2015 11/27/14   Historical Provider, MD  methocarbamol (ROBAXIN) 500 MG tablet Take 1 tablet (500 mg total) by mouth 2 (two) times daily as needed for muscle spasms. 05/27/15   Everlene Farrier, PA-C  metoCLOPramide (REGLAN) 5 MG tablet Reported on 08/07/2015  07/18/15   Historical Provider, MD  naproxen (NAPROSYN) 250 MG tablet Take 1 tablet (250 mg total) by mouth 2 (two) times daily with a meal. 05/27/15   Waynetta Pean, PA-C  omeprazole (PRILOSEC) 20 MG capsule Take 20 mg by mouth daily. 11/27/14   Historical Provider, MD  oxyCODONE-acetaminophen (PERCOCET) 10-325 MG tablet Take 1 tablet by mouth every 4 (four) hours as needed for pain.    Historical Provider, MD  promethazine (PHENERGAN) 12.5 MG tablet Reported on 08/07/2015 07/18/15   Historical Provider, MD  [place] KIT 17.5-3.13-1.6 GM/180ML SOLN Reported on 08/07/2015 07/18/15   Historical  Provider, MD  traZODone (DESYREL) 50 MG tablet Take 50 mg by mouth at bedtime as needed for sleep. Reported on 08/07/2015    Historical Provider, MD   BP 115/79 mmHg  Pulse 88  Temp(Src) 98.5 F (36.9 C) (Oral)  Resp 17  Ht '6\' 1"'$  (1.854 m)  Wt 92.987 kg  BMI 27.05 kg/m2  SpO2 99% Physical Exam  Constitutional: He is oriented to person, place, and time. He appears well-developed and well-nourished. No distress.  HENT:  Head: Normocephalic and atraumatic.  Right Ear: Tympanic membrane, external ear and ear canal normal.  Left Ear: Tympanic membrane, external ear and ear canal normal.  Nose: Nose normal. No rhinorrhea. Right sinus exhibits no maxillary sinus tenderness and no frontal sinus tenderness. Left sinus exhibits no maxillary sinus tenderness and no frontal sinus tenderness.  Mouth/Throat: Uvula is midline and mucous membranes are normal. No trismus in the jaw. Normal dentition. No dental abscesses or uvula swelling. Posterior oropharyngeal edema present. No oropharyngeal exudate, posterior oropharyngeal erythema or tonsillar abscesses.  No submental edema, tongue not elevated, no trismus. No impending airway obstruction; Pt able to speak full sentences, swallow intact, no drooling, stridor, or tonsillar/uvula displacement. No palatal petechia  Eyes: Conjunctivae are normal.  Neck: Trachea normal, normal range of motion and full passive range of motion without pain. Neck supple. No rigidity. Normal range of motion present. No Brudzinski's sign noted.  Flexion and extension of neck without pain or difficulty. Able to breath without difficulty in extension.  Cardiovascular: Normal rate and regular rhythm.   Pulmonary/Chest: Effort normal and breath sounds normal. No stridor. No respiratory distress. He has no wheezes.  Abdominal: Soft. There is no tenderness.  No obvious evidence of splenomegaly. Non ttp.   Musculoskeletal: Normal range of motion.  Lymphadenopathy:       Head (right  side): No submental, no submandibular, no tonsillar, no preauricular and no posterior auricular adenopathy present.       Head (left side): No submental, no submandibular, no tonsillar, no preauricular and no posterior auricular adenopathy present.    He has cervical adenopathy.  Neurological: He is alert and oriented to person, place, and time.  Skin: Skin is warm and dry. No rash noted. He is not diaphoretic.  Psychiatric: He has a normal mood and affect.  Nursing note and vitals reviewed.   ED Course  Procedures (including critical care time) Labs Review Labs Reviewed  RAPID STREP SCREEN (NOT AT [facility])  CULTURE, GROUP A STREP [facility])    Imaging Review No results found. I have personally reviewed and evaluated these images and lab results as part of my medical decision-making.   EKG Interpretation None      MDM   Final diagnoses:  None    Repeat strep is positive.  Patient eloped/Left AMA before the provider was notified.  He was rude to staff  per RN and said he was "getting the hell out of here".  No discharge paper work, referrals or prescriptions had been given. Pt was stable and would have received pain medication and an antibiotic.    Delos Haring, PA-C 08/24/15 2201  Carmin Muskrat, MD 08/25/15 [phone]

## 2015-08-24 NOTE — ED Notes (Signed)
Patient left AMA.  Patient did not want to sign paperwork.

## 2015-08-24 NOTE — ED Notes (Signed)
Charge RN has left message for pt to call back due to positive strep test that resulted after pt eloped.

## 2015-08-24 NOTE — ED Notes (Signed)
Patient was informed of the consequences and responsibilities of leaving against medical advice.  Patient fully A&O at the time of the decision.  Patient did not sign any papers nor receive his papers for discharge as he left before they could be given to him.

## 2015-08-25 ENCOUNTER — Encounter (HOSPITAL_BASED_OUTPATIENT_CLINIC_OR_DEPARTMENT_OTHER): Payer: Self-pay | Admitting: Emergency Medicine

## 2015-08-25 ENCOUNTER — Emergency Department (HOSPITAL_BASED_OUTPATIENT_CLINIC_OR_DEPARTMENT_OTHER)
Admission: EM | Admit: 2015-08-25 | Discharge: 2015-08-25 | Disposition: A | Discharge status: Home / Self Care | Payer: Medicare Other | Attending: Emergency Medicine | Admitting: Emergency Medicine

## 2015-08-25 DIAGNOSIS — I10 Essential (primary) hypertension: Secondary | ICD-10-CM | POA: Insufficient documentation

## 2015-08-25 DIAGNOSIS — Z8507 Personal history of malignant neoplasm of pancreas: Secondary | ICD-10-CM | POA: Diagnosis not present

## 2015-08-25 DIAGNOSIS — J020 Streptococcal pharyngitis: Secondary | ICD-10-CM | POA: Insufficient documentation

## 2015-08-25 DIAGNOSIS — J029 Acute pharyngitis, unspecified: Secondary | ICD-10-CM | POA: Diagnosis present

## 2015-08-25 DIAGNOSIS — F17210 Nicotine dependence, cigarettes, uncomplicated: Secondary | ICD-10-CM | POA: Insufficient documentation

## 2015-08-25 DIAGNOSIS — Z79899 Other long term (current) drug therapy: Secondary | ICD-10-CM | POA: Insufficient documentation

## 2015-08-25 MED ORDER — PENICILLIN G BENZATHINE 1200000 UNIT/2ML IM SUSP
1.2000 10*6.[IU] | Freq: Once | INTRAMUSCULAR | Status: AC
  Administered 2015-08-25: 1.2 10*6.[IU] via INTRAMUSCULAR
  Filled 2015-08-25: qty 2

## 2015-08-25 NOTE — ED Notes (Signed)
Pain and redness on the right side of throat since yesterday.  Obvious pain with swallowing.  Tender to touch on right side of neck.

## 2015-08-25 NOTE — ED Provider Notes (Signed)
CSN: [id number]     Arrival date & time 08/25/15  0058 History   First MD Initiated Contact with Patient 08/25/15 0242     Chief Complaint  Patient presents with  . Sore Throat     (Consider location/radiation/quality/duration/timing/severity/associated sxs/prior Treatment) HPI  This is a 50 year old male with a two-day history of sore throat. The pain is primarily on the right side of his throat and worse with swallowing. Pain is moderate to severe. He denies fever. He is also having right-sided anterior lymphadenopathy. He was seen at [facility] yesterday evening for strep test was positive but he left AGAINST MEDICAL ADVICE.   Past Medical History  Diagnosis Date  . Bleeding ulcer   . Personality disorder   . Mood disorder ([place])   . Anxiety disorder   . Pancreatic cancer ([place])   . Hypertension   . Heart murmur     "slight"  . Pancreatic cancer [facility])    Past Surgical History  Procedure Laterality Date  . Tonsillectomy      childhood  . Cervical fusion  2006, 2009    x 2  . Back surgery  2011    lower back   Family History  Problem Relation Age of Onset  . Hypertension Father   . Depression Brother   . Cancer Mother    Social History  Substance Use Topics  . Smoking status: Current Every Day Smoker -- 0.50 packs/day    Types: Cigarettes  . Smokeless tobacco: Never Used     Comment: decrease in daily smoking , 08/07/15 3 cigs/day  . Alcohol Use: No     Comment:  history of past alcohol use  goes to NA     Review of Systems  All other systems reviewed and are negative.   Allergies  Celebrex; Fentanyl; Morphine and related; Amlodipine; Morphine and related; Naproxen; Other; and Ibuprofen  Home Medications   Prior to Admission medications   Medication Sig Start Date End Date Taking? Authorizing Provider  albuterol (PROVENTIL) (2.5 MG/3ML) 0.083% nebulizer solution Take 2.5 mg by nebulization every 6 (six) hours as needed for wheezing or shortness of breath.  Reported on 08/07/2015    Historical Provider, MD  buPROPion (WELLBUTRIN XL) 150 MG 24 hr tablet 150 mg daily. 08/06/15   Historical Provider, MD  citalopram (CELEXA) 20 MG tablet Take 20 mg by mouth daily. Reported on 08/07/2015    Historical Provider, MD  lisinopril-hydrochlorothiazide (PRINZIDE,ZESTORETIC) 20-12.5 MG per tablet Take 1 tablet by mouth daily.    Historical Provider, MD  meloxicam (MOBIC) 7.5 MG tablet Take 7.5 mg by mouth daily as needed for pain. Reported on 08/07/2015 11/27/14   Historical Provider, MD  methocarbamol (ROBAXIN) 500 MG tablet Take 1 tablet (500 mg total) by mouth 2 (two) times daily as needed for muscle spasms. Patient not taking: Reported on 08/24/2015 05/27/15   Waynetta Pean, PA-C  metoCLOPramide (REGLAN) 5 MG tablet Reported on 08/07/2015 07/18/15   Historical Provider, MD  naproxen (NAPROSYN) 250 MG tablet Take 1 tablet (250 mg total) by mouth 2 (two) times daily with a meal. Patient not taking: Reported on 08/24/2015 05/27/15   Waynetta Pean, PA-C  omeprazole (PRILOSEC) 20 MG capsule Take 20 mg by mouth daily. 11/27/14   Historical Provider, MD  oxyCODONE-acetaminophen (PERCOCET) 10-325 MG tablet Take 1 tablet by mouth every 4 (four) hours as needed for pain.    Historical Provider, MD  promethazine (PHENERGAN) 12.5 MG tablet Reported on 08/07/2015 07/18/15  Historical Provider, MD  [place] KIT 17.5-3.13-1.6 GM/180ML SOLN Reported on 08/07/2015 07/18/15   Historical Provider, MD  traZODone (DESYREL) 50 MG tablet Take 50 mg by mouth at bedtime as needed for sleep. Reported on 08/07/2015    Historical Provider, MD   BP 124/87 mmHg  Pulse 81  Temp(Src) 99.1 F (37.3 C) (Oral)  Resp 16  Ht _0  (1.854 m)  Wt 205 lb (92.987 kg)  BMI 27.05 kg/m2  SpO2 99%   Physical Exam  General: Well-developed, well-nourished male in no acute distress; appearance consistent with age of record HENT: normocephalic; atraumatic; pharyngeal erythema without exudate, trismus, dysphonia  or stridor; uvula midline Eyes: pupils equal, round and reactive to light; extraocular muscles intact; arcus senilis bilaterally Neck: supple Heart: regular rate and rhythm Lungs: clear to auscultation bilaterally Abdomen: soft; nondistended; nontender; no masses or hepatosplenomegaly; bowel sounds present Extremities: No deformity; full range of motion; pulses normal Neurologic: Awake, alert and oriented; motor function intact in all extremities and symmetric; no facial droop Skin: Warm and dry Psychiatric: Flat affect    ED Course  Procedures (including critical care time)   MDM   Nursing notes and vitals signs, including pulse oximetry, reviewed.  Summary of this visit's results, reviewed by myself:  Labs:  Results for orders placed or performed during the hospital encounter of 08/24/15 (from the past 24 hour(s))  Rapid strep screen     Status: None   Collection Time: 08/24/15  5:17 PM  Result Value Ref Range   Streptococcus, Group A Screen (Direct) NEGATIVE NEGATIVE  Rapid strep screen (not at [facility])     Status: Abnormal   Collection Time: 08/24/15  8:48 PM  Result Value Ref Range   Streptococcus, Group A Screen (Direct) POSITIVE (A) NEGATIVE       Shanon Rosser, MD 08/25/15 [phone]

## 2015-08-27 LAB — CULTURE, GROUP A STREP (THRC)

## 2015-10-19 ENCOUNTER — Encounter (HOSPITAL_BASED_OUTPATIENT_CLINIC_OR_DEPARTMENT_OTHER): Payer: Self-pay | Admitting: *Deleted

## 2015-10-19 ENCOUNTER — Emergency Department (HOSPITAL_BASED_OUTPATIENT_CLINIC_OR_DEPARTMENT_OTHER)
Admission: EM | Admit: 2015-10-19 | Discharge: 2015-10-19 | Disposition: A | Discharge status: Home / Self Care | Payer: Medicare Other | Attending: Emergency Medicine | Admitting: Emergency Medicine

## 2015-10-19 DIAGNOSIS — I10 Essential (primary) hypertension: Secondary | ICD-10-CM | POA: Diagnosis not present

## 2015-10-19 DIAGNOSIS — F17210 Nicotine dependence, cigarettes, uncomplicated: Secondary | ICD-10-CM | POA: Insufficient documentation

## 2015-10-19 DIAGNOSIS — R369 Urethral discharge, unspecified: Secondary | ICD-10-CM | POA: Insufficient documentation

## 2015-10-19 DIAGNOSIS — Z8507 Personal history of malignant neoplasm of pancreas: Secondary | ICD-10-CM | POA: Diagnosis not present

## 2015-10-19 MED ORDER — AZITHROMYCIN 250 MG PO TABS
1000.0000 mg | ORAL_TABLET | Freq: Once | ORAL | Status: AC
  Administered 2015-10-19: 1000 mg via ORAL
  Filled 2015-10-19: qty 4

## 2015-10-19 MED ORDER — CEFTRIAXONE SODIUM 250 MG IJ SOLR
250.0000 mg | Freq: Once | INTRAMUSCULAR | Status: AC
  Administered 2015-10-19: 250 mg via INTRAMUSCULAR
  Filled 2015-10-19: qty 250

## 2015-10-19 NOTE — ED Notes (Signed)
Pt is asking when he is going to be seen.  Nurse attempts to ask pt about his symptoms and about his visit at [facility] on 10/02/15.  Pt states that his wife slept with someone else and now he has a discharge.  Pt informed that his tests were negative from visit on 8/1 and he was treated, so if he then had intercourse with the same person, then he was exposed again, and that person needs to be treated.  Pt began to raise his voice with nurse and state that he does not want to talk to nurse anymore.  Nurse informed pt that there is still a bit of a wait and that she was trying to figure out the exact course of the patient's symptoms.  Pt continued to raise his voice and say, "so what, I have a discharge, so that's that.  I came here to see a doctor, not a nurse, so bye."  Pt is no longer answering nurse's questions.

## 2015-10-19 NOTE — ED Notes (Signed)
Pt refused discharge instructions from nurse, denies any needs at this time.

## 2015-10-19 NOTE — ED Provider Notes (Signed)
[place] DEPT [facility] Provider Note   CSN: [id number] Arrival date & time: 10/19/15  0103     History   Chief Complaint Chief Complaint  Patient presents with  . Penile Discharge    HPI Fernando Diaz is a 50 y.o. male who was treated at another facility for penile discharge on August 1 of this month. GC and chlamydia testing resulted negative for both. He states he has had sexual intercourse with the same partner since but she has also been treated and they're no longer together.  He noticed a white penile discharge yesterday evening when he was urinating. There is no associated dysuria, penile pain or abdominal pain.  HPI  Past Medical History:  Diagnosis Date  . Anxiety disorder   . Bleeding ulcer   . Heart murmur    "slight"  . Hypertension   . Mood disorder ([place])   . Pancreatic cancer ([place])   . Personality disorder     Patient Active Problem List   Diagnosis Date Noted  . Depression 11/16/2014  . Hypertension 11/16/2014  . GERD (gastroesophageal reflux disease) 11/16/2014  . 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 L-S radiculopathy 11/15/2012  . Cervical pain 11/15/2012  . Chronic pain associated with significant psychosocial dysfunction 11/15/2012    Past Surgical History:  Procedure Laterality Date  . BACK SURGERY  2011   lower back  . CERVICAL FUSION  2006, 2009   x 2  . TONSILLECTOMY     childhood       Home Medications    Prior to Admission medications   Medication Sig Start Date End Date Taking? Authorizing Provider  omeprazole (PRILOSEC) 20 MG capsule Take 20 mg by mouth daily. 11/27/14   Historical Provider, MD    Family History Family History  Problem Relation Age of Onset  . Hypertension Father   . Depression Brother   . Cancer Mother     Social History Social History  Substance Use Topics  . Smoking status: Current Every Day Smoker    Packs/day: 0.50    Types: Cigarettes  . Smokeless tobacco: Never Used   Comment: decrease in daily smoking , 08/07/15 3 cigs/day  . Alcohol use No     Comment:  history of past alcohol use  goes to NA      Allergies   Celebrex [celecoxib]; Fentanyl; Morphine and related; Amlodipine; Morphine and related; Naproxen; Other; and Ibuprofen   Review of Systems Review of Systems  All other systems reviewed and are negative.    Physical Exam Updated Vital Signs BP 116/81   Pulse 89   Temp 98.1 F (36.7 C)   Resp 16   Ht 6\' 1"  (1.854 m)   Wt 207 lb (93.9 kg)   SpO2 100%   BMI 27.31 kg/m   Physical Exam General: Well-developed, well-nourished male in no acute distress; appearance consistent with age of record HENT: normocephalic; atraumatic Eyes: Normal appearance Neck: supple Heart: regular rate and rhythm Lungs: clear to auscultation bilaterally Abdomen: soft; nondistended; nontender; bowel sounds present GU: Tanner 5 male, circumcised; no urethral discharge seen Extremities: No deformity; full range of motion Neurologic: Awake, alert and oriented; motor function intact in all extremities and symmetric; no facial droop Skin: Warm and dry Psychiatric: Normal mood and affect    ED Treatments / Results    Procedures (including critical care time)   Final Clinical Impressions(s) / ED Diagnoses   Final diagnoses:  Penile discharge  Shanon Rosser, MD 10/19/15 [phone]

## 2015-10-19 NOTE — ED Triage Notes (Signed)
Pt c/o penis discharge x 1 day

## 2015-10-22 LAB — GC/CHLAMYDIA PROBE AMP (~~LOC~~) NOT AT ARMC
CHLAMYDIA, DNA PROBE: NEGATIVE
NEISSERIA GONORRHEA: NEGATIVE

## 2015-11-15 ENCOUNTER — Ambulatory Visit (HOSPITAL_COMMUNITY)
Admission: RE | Admit: 2015-11-15 | Discharge: 2015-11-15 | Disposition: A | Discharge status: Home / Self Care | Payer: Medicare Other | Attending: Internal Medicine | Admitting: Internal Medicine

## 2015-11-15 ENCOUNTER — Encounter (HOSPITAL_COMMUNITY): Payer: Self-pay | Admitting: Emergency Medicine

## 2015-11-15 DIAGNOSIS — I10 Essential (primary) hypertension: Secondary | ICD-10-CM | POA: Insufficient documentation

## 2015-11-15 DIAGNOSIS — Z202 Contact with and (suspected) exposure to infections with a predominantly sexual mode of transmission: Secondary | ICD-10-CM | POA: Insufficient documentation

## 2015-11-15 DIAGNOSIS — Z886 Allergy status to analgesic agent status: Secondary | ICD-10-CM | POA: Insufficient documentation

## 2015-11-15 DIAGNOSIS — Z8507 Personal history of malignant neoplasm of pancreas: Secondary | ICD-10-CM | POA: Insufficient documentation

## 2015-11-15 DIAGNOSIS — M542 Cervicalgia: Secondary | ICD-10-CM | POA: Insufficient documentation

## 2015-11-15 DIAGNOSIS — K219 Gastro-esophageal reflux disease without esophagitis: Secondary | ICD-10-CM | POA: Diagnosis not present

## 2015-11-15 DIAGNOSIS — R369 Urethral discharge, unspecified: Secondary | ICD-10-CM | POA: Insufficient documentation

## 2015-11-15 DIAGNOSIS — F329 Major depressive disorder, single episode, unspecified: Secondary | ICD-10-CM | POA: Diagnosis not present

## 2015-11-15 DIAGNOSIS — G894 Chronic pain syndrome: Secondary | ICD-10-CM | POA: Diagnosis not present

## 2015-11-15 DIAGNOSIS — K274 Chronic or unspecified peptic ulcer, site unspecified, with hemorrhage: Secondary | ICD-10-CM | POA: Insufficient documentation

## 2015-11-15 DIAGNOSIS — F17210 Nicotine dependence, cigarettes, uncomplicated: Secondary | ICD-10-CM | POA: Diagnosis not present

## 2015-11-15 DIAGNOSIS — R011 Cardiac murmur, unspecified: Secondary | ICD-10-CM | POA: Insufficient documentation

## 2015-11-15 DIAGNOSIS — F419 Anxiety disorder, unspecified: Secondary | ICD-10-CM | POA: Insufficient documentation

## 2015-11-15 NOTE — ED Triage Notes (Signed)
Patient reports penile discharge that patient noticed yesterday

## 2015-11-15 NOTE — ED Provider Notes (Signed)
[place]    CSN: [id number] Arrival date & time: 11/15/15  1427  First Provider Contact:  None       History   Chief Complaint Chief Complaint  Patient presents with  . Exposure to STD    HPI Fernando Diaz is a 50 y.o. male.   50 yo male c/o painless white penile discharge since yesterday.  Reports unprotected sex a few days ago with a former partner.        Past Medical History:  Diagnosis Date  . Anxiety disorder   . Bleeding ulcer   . Heart murmur    "slight"  . Hypertension   . Mood disorder ([place])   . Pancreatic cancer ([place])   . Personality disorder     Patient Active Problem List   Diagnosis Date Noted  . Depression 11/16/2014  . Hypertension 11/16/2014  . GERD (gastroesophageal reflux disease) 11/16/2014  . 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 L-S radiculopathy 11/15/2012  . Cervical pain 11/15/2012  . Chronic pain associated with significant psychosocial dysfunction 11/15/2012    Past Surgical History:  Procedure Laterality Date  . BACK SURGERY  2011   lower back  . CERVICAL FUSION  2006, 2009   x 2  . TONSILLECTOMY     childhood       Home Medications    Prior to Admission medications   Medication Sig Start Date End Date Taking? Authorizing Provider  omeprazole (PRILOSEC) 20 MG capsule Take 20 mg by mouth daily. 11/27/14   Historical Provider, MD    Family History Family History  Problem Relation Age of Onset  . Hypertension Father   . Depression Brother   . Cancer Mother     Social History Social History  Substance Use Topics  . Smoking status: Current Every Day Smoker    Packs/day: 0.50    Types: Cigarettes  . Smokeless tobacco: Never Used     Comment: decrease in daily smoking , 08/07/15 3 cigs/day  . Alcohol use No     Comment:  history of past alcohol use  goes to NA      Allergies   Celebrex [celecoxib]; Fentanyl; Morphine and related; Amlodipine; Morphine and related; Naproxen; Other;  and Ibuprofen   Review of Systems Review of Systems  Constitutional: Negative for chills and fever.  HENT: Negative for sore throat and tinnitus.   Eyes: Negative for redness.  Respiratory: Negative for cough and shortness of breath.   Cardiovascular: Negative for chest pain and palpitations.  Gastrointestinal: Negative for abdominal pain, diarrhea, nausea and vomiting.  Genitourinary: Negative for dysuria, frequency and urgency.  Musculoskeletal: Negative for myalgias.  Skin: Negative for rash.       No lesions  Neurological: Negative for weakness.  Hematological: Does not bruise/bleed easily.  Psychiatric/Behavioral: Negative for suicidal ideas.     Physical Exam Triage Vital Signs ED Triage Vitals [11/15/15 1524]  Enc Vitals Group     BP 133/90     Pulse Rate 81     Resp 18     Temp 98.7 F (37.1 C)     Temp Source Oral     SpO2 98 %     Weight      Height      Head Circumference      Peak Flow      Pain Score      Pain Loc      Pain Edu?  Excl. in [place]?    No data found.   Updated Vital Signs BP 133/90 (BP Location: Right Arm)   Pulse 81   Temp 98.7 F (37.1 C) (Oral)   Resp 18   SpO2 98%   Visual Acuity Right Eye Distance:   Left Eye Distance:   Bilateral Distance:    Right Eye Near:   Left Eye Near:    Bilateral Near:     Physical Exam  Constitutional: He is oriented to person, place, and time. He appears well-developed and well-nourished. No distress.  HENT:  Head: Normocephalic and atraumatic.  Mouth/Throat: Oropharynx is clear and moist.  Eyes: Conjunctivae and EOM are normal. Pupils are equal, round, and reactive to light. No scleral icterus.  Neck: Normal range of motion. Neck supple. No JVD present. No tracheal deviation present. No thyromegaly present.  Cardiovascular: Normal rate, regular rhythm and normal heart sounds.  Exam reveals no gallop and no friction rub.   No murmur heard. Pulmonary/Chest: Effort normal and breath sounds  normal. No respiratory distress.  Abdominal: Soft. Bowel sounds are normal. He exhibits no distension. There is no tenderness.  Musculoskeletal: Normal range of motion. He exhibits no edema.  Lymphadenopathy:    He has no cervical adenopathy.  Neurological: He is alert and oriented to person, place, and time. No cranial nerve deficit.  Skin: Skin is warm and dry. No rash noted. No erythema.  Psychiatric: He has a normal mood and affect. His behavior is normal. Judgment and thought content normal.     UC Treatments / Results  Labs (all labs ordered are listed, but only abnormal results are displayed) Labs Reviewed  HIV ANTIBODY (ROUTINE TESTING)  RPR  URINE CYTOLOGY ANCILLARY ONLY    EKG  EKG Interpretation None       Radiology No results found.  Procedures Procedures (including critical care time)  Medications Ordered in UC Medications - No data to display   Initial Impression / Assessment and Plan / UC Course  I have reviewed the triage vital signs and the nursing notes.  Pertinent labs & imaging results that were available during my care of the patient were reviewed by me and considered in my medical decision making (see chart for details).  Clinical Course    STI testing; will treat for trich/BV exposure if GC/Chlam negative.  Final Clinical Impressions(s) / UC Diagnoses   Final diagnoses:  Penile discharge    New Prescriptions New Prescriptions   No medications on file     Harrie Foreman, MD 11/15/15 1601

## 2015-11-15 NOTE — ED Notes (Signed)
Obtained "dirty" and "clean" urine

## 2015-11-16 LAB — URINE CYTOLOGY ANCILLARY ONLY
Chlamydia: NEGATIVE
Neisseria Gonorrhea: NEGATIVE
Trichomonas: POSITIVE — AB

## 2015-11-16 LAB — HIV ANTIBODY (ROUTINE TESTING W REFLEX): HIV Screen 4th Generation wRfx: NONREACTIVE

## 2015-11-16 LAB — RPR: RPR Ser Ql: NONREACTIVE

## 2015-11-17 ENCOUNTER — Telehealth (HOSPITAL_COMMUNITY): Payer: Self-pay | Admitting: Internal Medicine

## 2015-11-17 MED ORDER — METRONIDAZOLE 500 MG PO TABS: 2000.0000 mg | ORAL_TABLET | Freq: Once | ORAL | 0 refills | Status: AC

## 2015-11-17 NOTE — Telephone Encounter (Signed)
Clinical staff, please let patient know that test for trichomonas was positive.  Rx for metronidazole was sent to the pharmacy of record, [facility] at [place].  Recheck for further evaluation if discharge persists.  LM

## 2015-11-22 ENCOUNTER — Telehealth (HOSPITAL_COMMUNITY): Payer: Self-pay | Admitting: Emergency Medicine

## 2015-11-22 MED ORDER — METRONIDAZOLE 500 MG PO TABS: 500.0000 mg | ORAL_TABLET | Freq: Two times a day (BID) | ORAL | 0 refills | Status: AC

## 2015-11-22 NOTE — Telephone Encounter (Signed)
-----   Message from Sherlene Shams, MD sent at 11/17/2015  9:33 PM EDT ----- Clinical staff, please let patient know that test for trichomonas was positive.   Rx for metronidazole was sent to the pharmacy of record, [facility] at [place].   Recheck for further evaluation if discharge persists.  LM

## 2015-11-22 NOTE — Telephone Encounter (Signed)
Called pt and notified of recent lab results Pt ID'd properly... Reports feeling better and sx have subsided E-Rx Flagyl to [facility] ([place] [place]) per pt. Adv pt if sx are not getting better to return or to f/u w/PCP Education on safe sex given Also adv pt to notify partner(s) Pt verb understanding.

## 2015-12-26 ENCOUNTER — Encounter (HOSPITAL_COMMUNITY): Payer: Self-pay | Admitting: *Deleted

## 2015-12-26 ENCOUNTER — Emergency Department (HOSPITAL_COMMUNITY): Payer: Medicare Other

## 2015-12-26 ENCOUNTER — Emergency Department (HOSPITAL_COMMUNITY)
Admission: EM | Admit: 2015-12-26 | Discharge: 2015-12-26 | Disposition: A | Discharge status: Home / Self Care | Payer: Medicare Other | Attending: Emergency Medicine | Admitting: Emergency Medicine

## 2015-12-26 DIAGNOSIS — I10 Essential (primary) hypertension: Secondary | ICD-10-CM | POA: Diagnosis not present

## 2015-12-26 DIAGNOSIS — Z8507 Personal history of malignant neoplasm of pancreas: Secondary | ICD-10-CM | POA: Diagnosis not present

## 2015-12-26 DIAGNOSIS — Z79899 Other long term (current) drug therapy: Secondary | ICD-10-CM | POA: Diagnosis not present

## 2015-12-26 DIAGNOSIS — Y92410 Unspecified street and highway as the place of occurrence of the external cause: Secondary | ICD-10-CM | POA: Diagnosis not present

## 2015-12-26 DIAGNOSIS — S83402A Sprain of unspecified collateral ligament of left knee, initial encounter: Secondary | ICD-10-CM | POA: Diagnosis not present

## 2015-12-26 DIAGNOSIS — S0003XA Contusion of scalp, initial encounter: Secondary | ICD-10-CM | POA: Diagnosis not present

## 2015-12-26 DIAGNOSIS — Y9355 Activity, bike riding: Secondary | ICD-10-CM | POA: Diagnosis not present

## 2015-12-26 DIAGNOSIS — Y999 Unspecified external cause status: Secondary | ICD-10-CM | POA: Diagnosis not present

## 2015-12-26 DIAGNOSIS — F17210 Nicotine dependence, cigarettes, uncomplicated: Secondary | ICD-10-CM | POA: Diagnosis not present

## 2015-12-26 DIAGNOSIS — S025XXB Fracture of tooth (traumatic), initial encounter for open fracture: Secondary | ICD-10-CM | POA: Insufficient documentation

## 2015-12-26 DIAGNOSIS — S0990XA Unspecified injury of head, initial encounter: Secondary | ICD-10-CM | POA: Diagnosis present

## 2015-12-26 DIAGNOSIS — S8002XA Contusion of left knee, initial encounter: Secondary | ICD-10-CM

## 2015-12-26 DIAGNOSIS — T148XXA Other injury of unspecified body region, initial encounter: Secondary | ICD-10-CM

## 2015-12-26 DIAGNOSIS — S80211A Abrasion, right knee, initial encounter: Secondary | ICD-10-CM | POA: Diagnosis not present

## 2015-12-26 LAB — BASIC METABOLIC PANEL
Anion gap: 4 — ABNORMAL LOW (ref 5–15)
BUN: 10 mg/dL (ref 6–20)
CALCIUM: 8.3 mg/dL — AB (ref 8.9–10.3)
CHLORIDE: 110 mmol/L (ref 101–111)
CO2: 24 mmol/L (ref 22–32)
CREATININE: 0.95 mg/dL (ref 0.61–1.24)
GFR calc non Af Amer: >60 mL/min (ref >60)
Glucose, Bld: 118 mg/dL — ABNORMAL HIGH (ref 65–99)
Potassium: 4.2 mmol/L (ref 3.5–5.1)
SODIUM: 138 mmol/L (ref 135–145)

## 2015-12-26 LAB — CBC
HCT: 39.1 % (ref 39.0–52.0)
Hemoglobin: 13.3 g/dL (ref 13.0–17.0)
MCH: 27.8 pg (ref 26.0–34.0)
MCHC: 34 g/dL (ref 30.0–36.0)
MCV: 81.6 fL (ref 78.0–100.0)
PLATELETS: 200 10*3/uL (ref 150–400)
RBC: 4.79 MIL/uL (ref 4.22–5.81)
RDW: 16.4 % — AB (ref 11.5–15.5)
WBC: 7.2 10*3/uL (ref 4.0–10.5)

## 2015-12-26 MED ORDER — ONDANSETRON HCL 4 MG/2ML IJ SOLN
4.0000 mg | Freq: Once | INTRAMUSCULAR | Status: AC
  Administered 2015-12-26: 4 mg via INTRAVENOUS
  Filled 2015-12-26: qty 2

## 2015-12-26 MED ORDER — SODIUM CHLORIDE 0.9 % IV BOLUS (SEPSIS)
1000.0000 mL | Freq: Once | INTRAVENOUS | Status: AC
  Administered 2015-12-26: 1000 mL via INTRAVENOUS

## 2015-12-26 MED ORDER — HYDROMORPHONE HCL 2 MG/ML IJ SOLN
1.0000 mg | Freq: Once | INTRAMUSCULAR | Status: AC
  Administered 2015-12-26: 1 mg via INTRAVENOUS
  Filled 2015-12-26: qty 1

## 2015-12-26 MED ORDER — TETANUS-DIPHTH-ACELL PERTUSSIS 5-2.5-18.5 LF-MCG/0.5 IM SUSP: 0.5000 mL | Freq: Once | INTRAMUSCULAR | Status: DC

## 2015-12-26 MED ORDER — HYDROMORPHONE HCL 1 MG/ML IJ SOLN: 1.0000 mg | Freq: Once | INTRAMUSCULAR | Status: DC

## 2015-12-26 NOTE — ED Notes (Signed)
Undress patient patient placed on the monitor waiting for provider

## 2015-12-26 NOTE — ED Notes (Signed)
Helped patient get dress, help clean his wounds and batrion on wounds took saline lock out
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
## 2015-12-26 NOTE — Progress Notes (Signed)
Orthopedic Tech Progress Note Patient Details:  Fernando Diaz [facility] [birth year] [id number]  Ortho Devices Type of Ortho Device: Knee Immobilizer Ortho Device/Splint Location: lle Ortho Device/Splint Interventions: Application   Jacquel Redditt 12/26/2015, 4:53 PM
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
## 2015-12-26 NOTE — Discharge Instructions (Signed)
It was our pleasure to provide your ER care today - we hope that you feel better.  Your xrays were read as showing no acute fracture.  As we discussed, given your knee pain, you may have a ligamentous or other soft tissue injury.  Ice/coldpack to sore area. Wear knee immobilizer for comfort/support for the next few days.  Follow up with orthopedist in the next 1-2 weeks if knee pain fails to improve/resolve - see referral.  Keep abrasions very clean, wash with soap and water 2x/day.  Take tylenol or other over the counter pain medication as need.   For chipped tooth, follow up with dentist in the next few days - call today or tomorrow to arrange appointment.   Return to ER if worse, new symptoms, new or severe pain, other concern.   You were given pain medication in the ER - no driving for the next 6 hours.
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
## 2015-12-28 ENCOUNTER — Telehealth (INDEPENDENT_AMBULATORY_CARE_PROVIDER_SITE_OTHER): Payer: Self-pay | Admitting: *Deleted

## 2016-01-02 ENCOUNTER — Ambulatory Visit (INDEPENDENT_AMBULATORY_CARE_PROVIDER_SITE_OTHER): Payer: Medicare Other | Admitting: Physician Assistant

## 2016-01-10 ENCOUNTER — Ambulatory Visit (INDEPENDENT_AMBULATORY_CARE_PROVIDER_SITE_OTHER): Payer: Medicare Other | Admitting: Physician Assistant

## 2016-01-30 NOTE — Telephone Encounter (Signed)
ERROR
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
## 2016-09-02 IMAGING — CR DG LUMBAR SPINE COMPLETE 4+V
5 series · 5 of 5 positions shown · non-contrast
Comparison: Lumbar spine radiographs performed 07/21/2014

CLINICAL DATA: Status post fall, with lower back pain. Initial
encounter.

EXAM:
LUMBAR SPINE - COMPLETE 4+ VIEW

[l-spine ap]
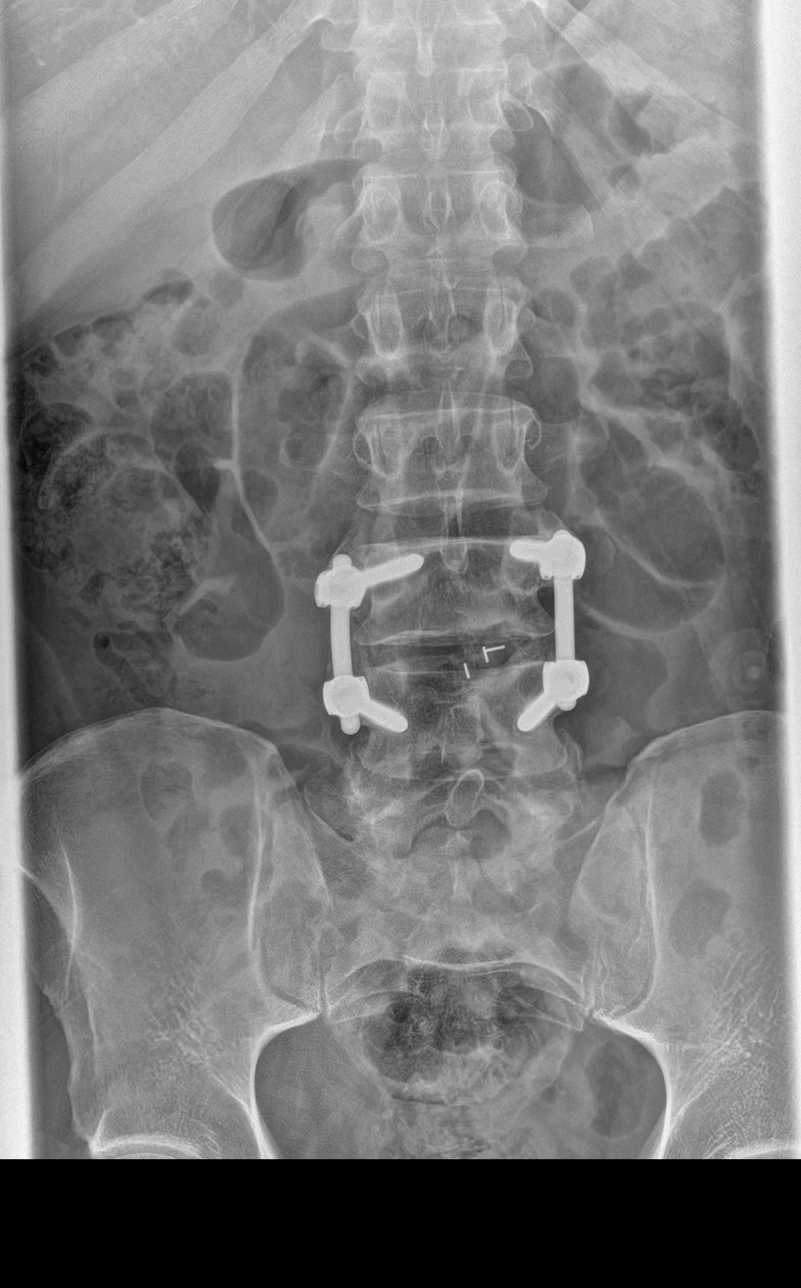

[l-spine obl (1 of 2)]
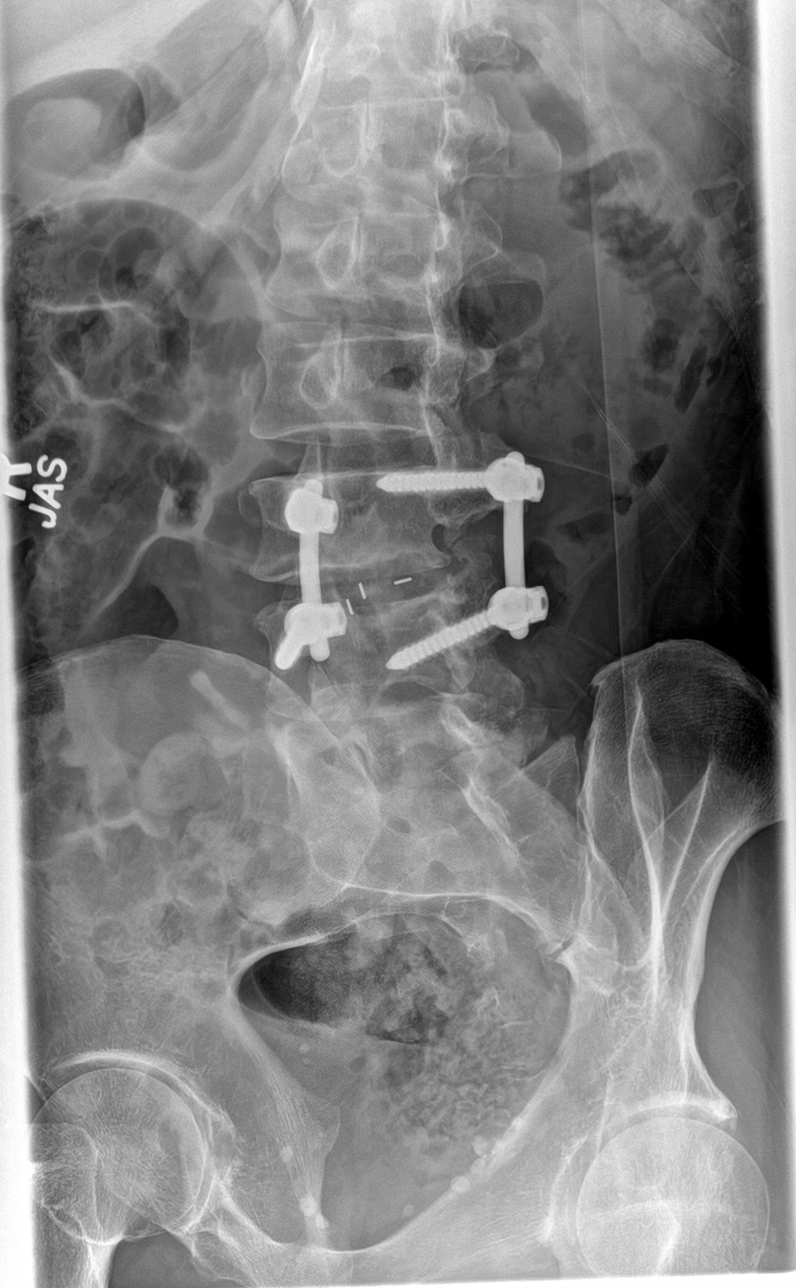

[l-spine obl (2 of 2)]
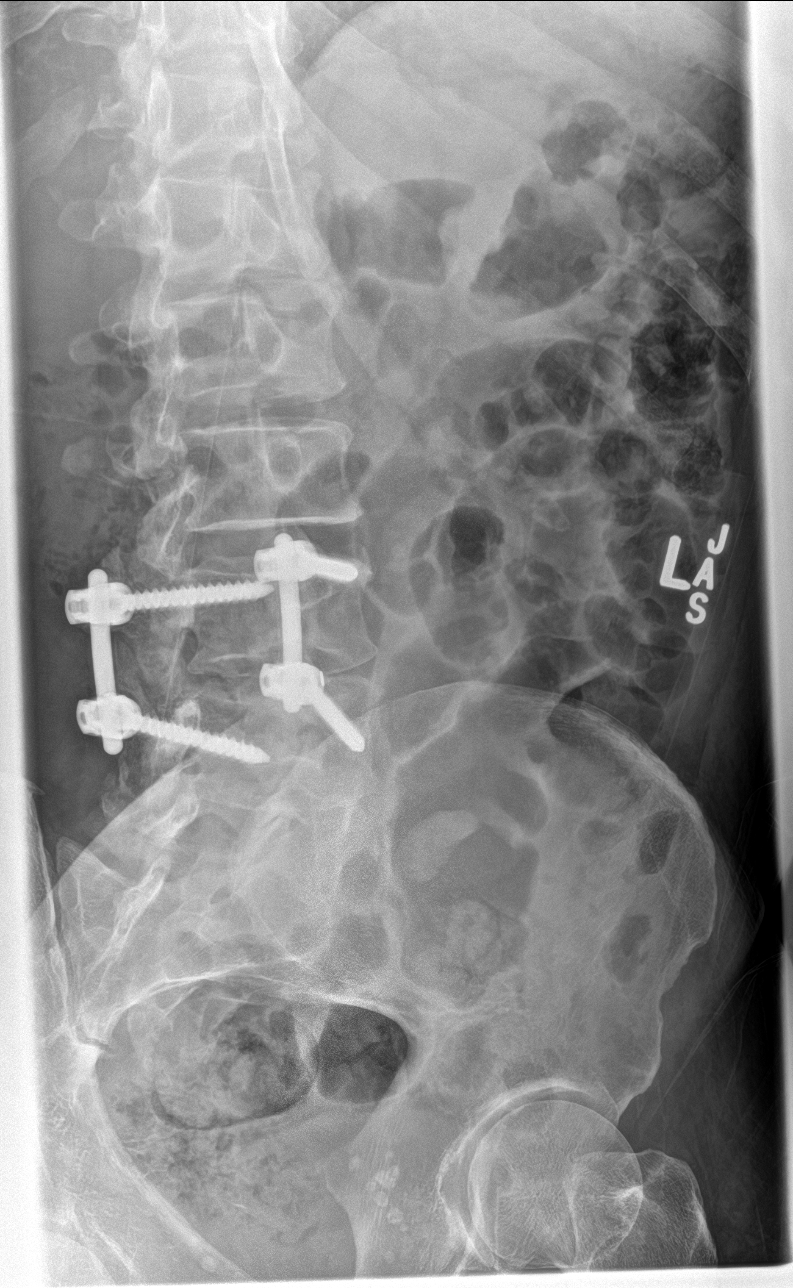

[l-spine lat]
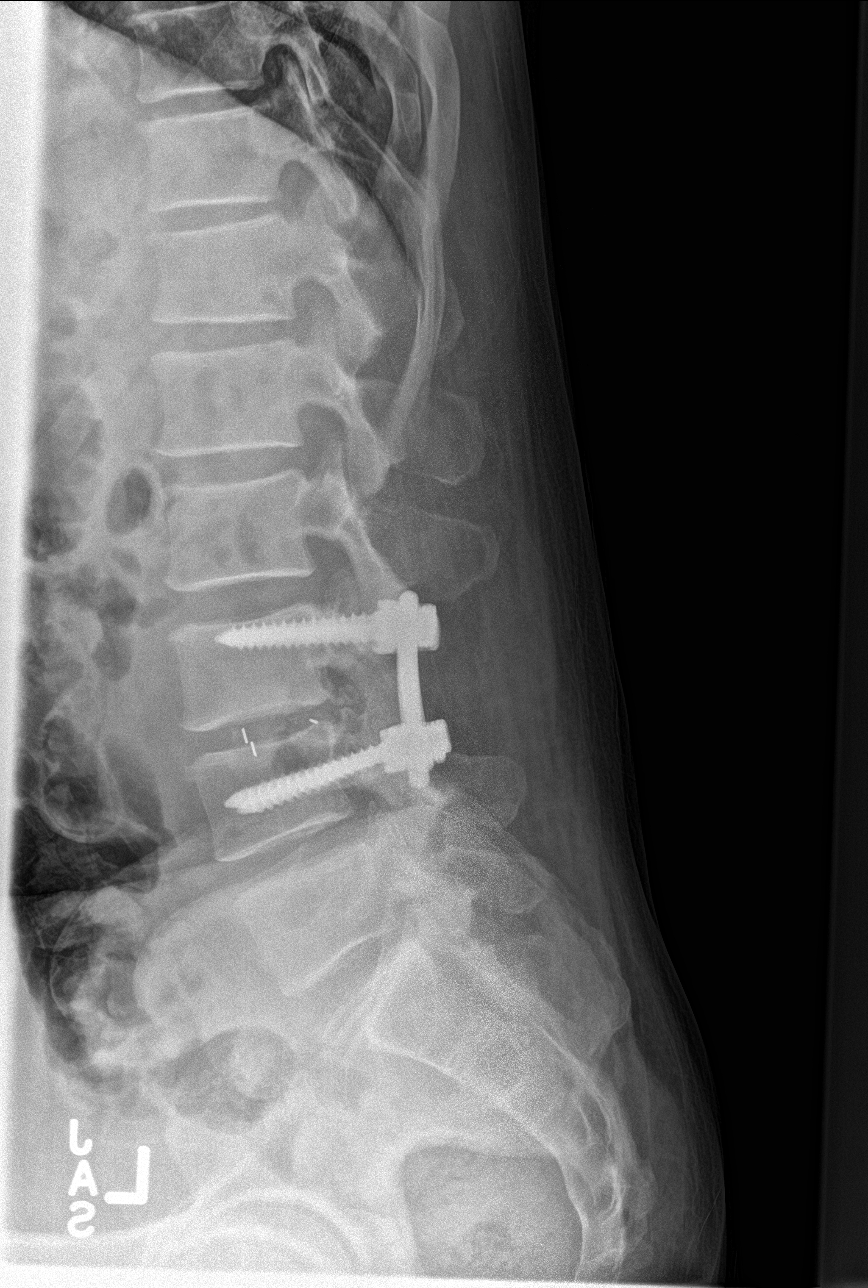

[l-spine spot]
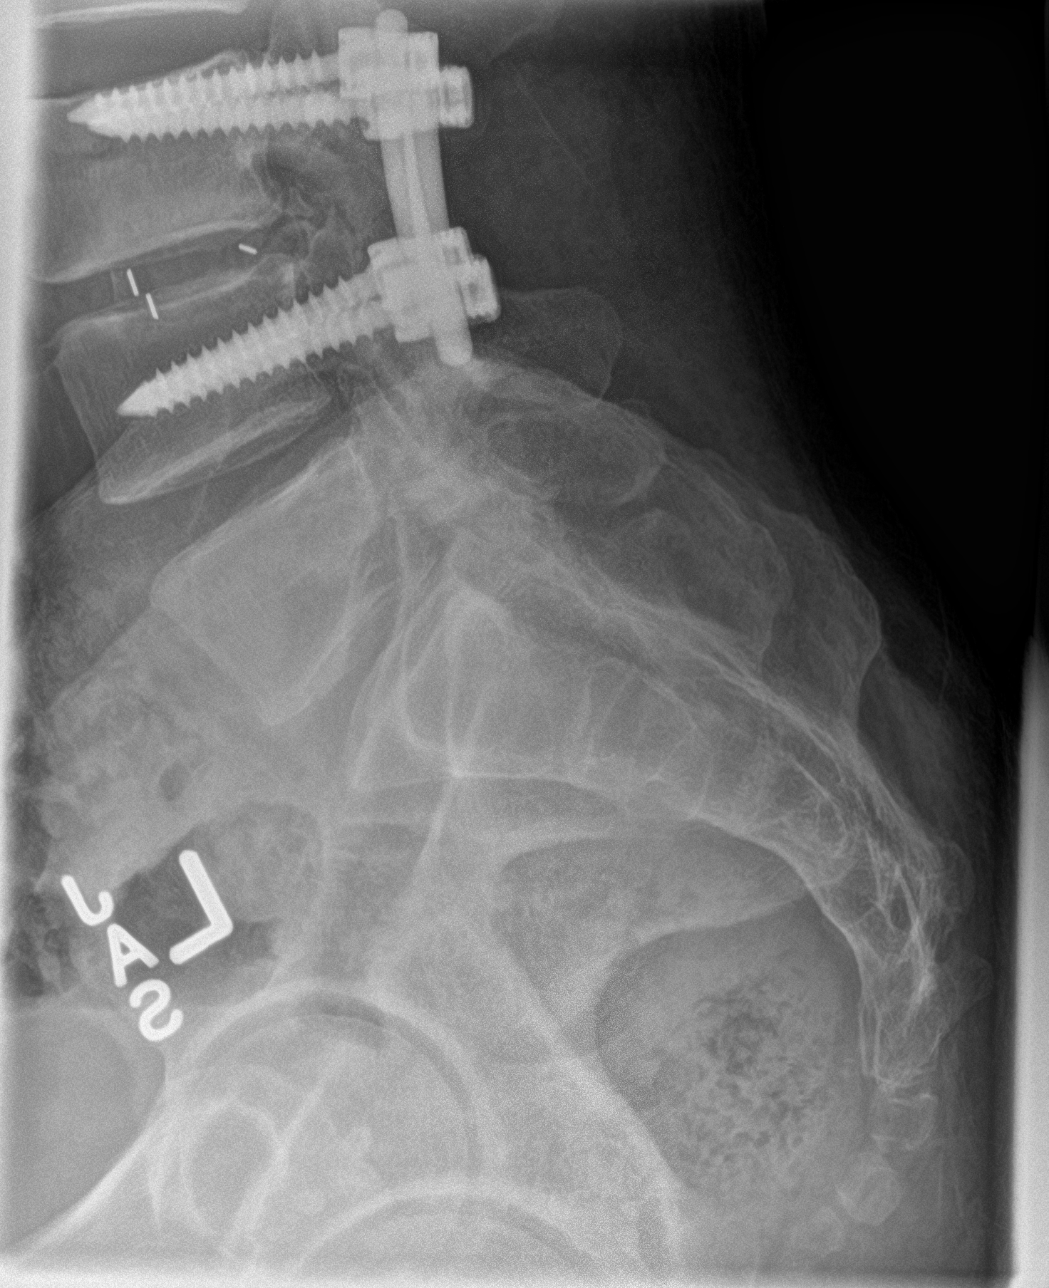

[5 of 5 positions shown; findings below may reference images not displayed]

FINDINGS: There is no evidence of fracture or subluxation. The patient is
status post lumbar spinal fusion at L4-L5; the patient has 6 lumbar
vertebral bodies. Vertebral bodies demonstrate normal height and
alignment. Intervertebral disc spaces are preserved. The visualized
neural foramina are grossly unremarkable in appearance.

The visualized bowel gas pattern is unremarkable in appearance; air
and stool are noted within the colon. The sacroiliac joints are
within normal limits.
IMPRESSION: No evidence of fracture or subluxation along the lumbar spine.
Status post lumbar spinal fusion at L4-L5.

## 2019-05-09 ENCOUNTER — Emergency Department (HOSPITAL_COMMUNITY)
Admission: EM | Admit: 2019-05-09 | Discharge: 2019-05-09 | Disposition: A | Discharge status: Home / Self Care | Payer: Medicare Other | Attending: Emergency Medicine | Admitting: Emergency Medicine

## 2019-05-09 ENCOUNTER — Emergency Department (HOSPITAL_COMMUNITY): Payer: Medicare Other

## 2019-05-09 ENCOUNTER — Encounter (HOSPITAL_COMMUNITY): Payer: Self-pay | Admitting: Emergency Medicine

## 2019-05-09 ENCOUNTER — Other Ambulatory Visit: Payer: Self-pay

## 2019-05-09 DIAGNOSIS — F17210 Nicotine dependence, cigarettes, uncomplicated: Secondary | ICD-10-CM | POA: Insufficient documentation

## 2019-05-09 DIAGNOSIS — R55 Syncope and collapse: Secondary | ICD-10-CM | POA: Insufficient documentation

## 2019-05-09 DIAGNOSIS — I10 Essential (primary) hypertension: Secondary | ICD-10-CM | POA: Insufficient documentation

## 2019-05-09 DIAGNOSIS — Z79899 Other long term (current) drug therapy: Secondary | ICD-10-CM | POA: Diagnosis not present

## 2019-05-09 LAB — CBC WITH DIFFERENTIAL/PLATELET
Abs Immature Granulocytes: 0.01 10*3/uL (ref 0.00–0.07)
Basophils Absolute: 0 10*3/uL (ref 0.0–0.1)
Basophils Relative: 1 %
Eosinophils Absolute: 0.2 10*3/uL (ref 0.0–0.5)
Eosinophils Relative: 3 %
HCT: 44.3 % (ref 39.0–52.0)
Hemoglobin: 14.5 g/dL (ref 13.0–17.0)
Immature Granulocytes: 0 %
Lymphocytes Relative: 25 %
Lymphs Abs: 1.6 10*3/uL (ref 0.7–4.0)
MCH: 26.8 pg (ref 26.0–34.0)
MCHC: 32.7 g/dL (ref 30.0–36.0)
MCV: 81.9 fL (ref 80.0–100.0)
Monocytes Absolute: 0.4 10*3/uL (ref 0.1–1.0)
Monocytes Relative: 6 %
Neutro Abs: 4.2 10*3/uL (ref 1.7–7.7)
Neutrophils Relative %: 65 %
Platelets: 244 10*3/uL (ref 150–400)
RBC: 5.41 MIL/uL (ref 4.22–5.81)
RDW: 18.9 % — ABNORMAL HIGH (ref 11.5–15.5)
WBC: 6.4 10*3/uL (ref 4.0–10.5)
nRBC: 0 % (ref 0.0–0.2)

## 2019-05-09 LAB — COMPREHENSIVE METABOLIC PANEL
ALT: 22 U/L (ref 0–44)
AST: 27 U/L (ref 15–41)
Albumin: 3.4 g/dL — ABNORMAL LOW (ref 3.5–5.0)
Alkaline Phosphatase: 52 U/L (ref 38–126)
Anion gap: 7 (ref 5–15)
BUN: 15 mg/dL (ref 6–20)
CO2: 23 mmol/L (ref 22–32)
Calcium: 8.3 mg/dL — ABNORMAL LOW (ref 8.9–10.3)
Chloride: 110 mmol/L (ref 98–111)
Creatinine, Ser: 0.95 mg/dL (ref 0.61–1.24)
GFR calc Af Amer: >60 mL/min (ref >60)
GFR calc non Af Amer: >60 mL/min (ref >60)
Glucose, Bld: 123 mg/dL — ABNORMAL HIGH (ref 70–99)
Potassium: 4.1 mmol/L (ref 3.5–5.1)
Sodium: 140 mmol/L (ref 135–145)
Total Bilirubin: 0.5 mg/dL (ref 0.3–1.2)
Total Protein: 6 g/dL — ABNORMAL LOW (ref 6.5–8.1)

## 2019-05-09 MED ORDER — SODIUM CHLORIDE 0.9 % IV BOLUS (SEPSIS)
1000.0000 mL | Freq: Once | INTRAVENOUS | Status: AC
  Administered 2019-05-09: 1000 mL via INTRAVENOUS
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
## 2019-05-09 NOTE — Discharge Instructions (Addendum)
Drink plenty of fluids and rest for the next day or 2.

## 2019-05-09 NOTE — ED Provider Notes (Signed)
[facility] DEPT Provider Note   CSN: [id number] Arrival date & time: 05/09/19  1112     History Chief Complaint  Patient presents with  . Dizziness  . Weakness  . Nausea  . Shortness of Breath    Fernando Diaz is a 54 y.o. male.  Patient states that he gave plasma today.  Later today he felt dizzy and passed out.  Patient feels weak today but improving  The history is provided by the patient. No language interpreter was used.  Dizziness Quality:  Lightheadedness Severity:  Mild Onset quality:  Sudden Timing:  Intermittent Progression:  Waxing and waning Chronicity:  New Context: not with bowel movement   Relieved by:  Nothing Worsened by:  Nothing Ineffective treatments:  None tried Associated symptoms: weakness   Associated symptoms: no blood in stool, no chest pain, no diarrhea and no headaches   Weakness Associated symptoms: dizziness   Associated symptoms: no abdominal pain, no chest pain, no cough, no diarrhea, no frequency, no headaches and no seizures   Shortness of Breath Associated symptoms: no abdominal pain, no chest pain, no cough, no headaches and no rash        Past Medical History:  Diagnosis Date  . Anxiety disorder   . Bleeding ulcer   . Heart murmur    "slight"  . Hypertension   . Mood disorder ([place])   . Pancreatic cancer ([place])   . Personality disorder [facility])     Patient Active Problem List   Diagnosis Date Noted  . Depression 11/16/2014  . Hypertension 11/16/2014  . GERD (gastroesophageal reflux disease) 11/16/2014  . 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 L-S radiculopathy 11/15/2012  . Cervical pain 11/15/2012  . Chronic pain associated with significant psychosocial dysfunction 11/15/2012    Past Surgical History:  Procedure Laterality Date  . BACK SURGERY  2011   lower back  . CERVICAL FUSION  2006, 2009   x 2  . TONSILLECTOMY     childhood       Family History  Problem  Relation Age of Onset  . Hypertension Father   . Depression Brother   . Cancer Mother     Social History   Tobacco Use  . Smoking status: Current Every Day Smoker    Packs/day: 0.50    Types: Cigarettes  . Smokeless tobacco: Never Used  . Tobacco comment: decrease in daily smoking , 08/07/15 3 cigs/day  Substance Use Topics  . Alcohol use: No    Alcohol/week: 0.0 standard drinks    Comment:  history of past alcohol use  goes to NA   . Drug use: No    Comment: h/o abuse    Home Medications Prior to Admission medications   Medication Sig Start Date End Date Taking? Authorizing Provider  amLODipine (NORVASC) 10 MG tablet Take 10 mg by mouth daily. 04/20/19  Yes [provider]  buPROPion (WELLBUTRIN XL) 300 MG 24 hr tablet Take 300 mg by mouth daily. 04/20/19  Yes [provider]  gabapentin (NEURONTIN) 300 MG capsule Take 300 mg by mouth 3 (three) times daily. 03/22/19  Yes [provider]  omeprazole (PRILOSEC) 40 MG capsule Take 40 mg by mouth daily. 03/22/19  Yes [provider]  oxyCODONE-acetaminophen (PERCOCET) 10-325 MG tablet Take 1 tablet by mouth 2 (two) times daily as needed for pain. LF 04/20/19 #60 04/20/19  Yes [provider]  QUEtiapine (SEROQUEL) 50 MG tablet Take 50 mg  by mouth at bedtime. 04/20/19  Yes [provider]  traZODone (DESYREL) 50 MG tablet Take 50 mg by mouth at bedtime. 04/20/19  Yes [provider]  metroNIDAZOLE (FLAGYL) 500 MG tablet Take 1 tablet (500 mg total) by mouth 2 (two) times daily. Patient not taking: Reported on 12/26/2015 11/22/15   Wynona Luna, MD  albuterol (PROVENTIL) (2.5 MG/3ML) 0.083% nebulizer solution Take 2.5 mg by nebulization every 6 (six) hours as needed for wheezing or shortness of breath. Reported on 08/07/2015  08/25/15  [provider]  lisinopril-hydrochlorothiazide (PRINZIDE,ZESTORETIC) 20-12.5 MG per tablet Take 1 tablet by mouth daily.  08/25/15   [provider]  metoCLOPramide (REGLAN) 5 MG tablet Reported on 08/07/2015 07/18/15 08/25/15  [provider]    Allergies    Celebrex [celecoxib], Fentanyl, Morphine and related, Amlodipine, Morphine and related, Naproxen, Other, and Ibuprofen  Review of Systems   Review of Systems  Constitutional: Negative for appetite change and fatigue.  HENT: Negative for congestion, ear discharge and sinus pressure.   Eyes: Negative for discharge.  Respiratory: Negative for cough.   Cardiovascular: Negative for chest pain.  Gastrointestinal: Negative for abdominal pain, blood in stool and diarrhea.  Genitourinary: Negative for frequency and hematuria.  Musculoskeletal: Negative for back pain.  Skin: Negative for rash.  Neurological: Positive for dizziness and weakness. Negative for seizures and headaches.  Psychiatric/Behavioral: Negative for hallucinations.    Physical Exam Updated Vital Signs BP (!) 128/94   Pulse 65   Temp 98.1 F (36.7 C) (Oral)   Resp (!) 22   Ht 6\' 1"  (1.854 m)   Wt 97.5 kg   SpO2 100%   BMI 28.37 kg/m   Physical Exam Vitals and nursing note reviewed.  Constitutional:      Appearance: He is well-developed.  HENT:     Head: Normocephalic.     Nose: Nose normal.  Eyes:     General: No scleral icterus.    Conjunctiva/sclera: Conjunctivae normal.  Neck:     Thyroid: No thyromegaly.  Cardiovascular:     Rate and Rhythm: Normal rate and regular rhythm.     Heart sounds: No murmur. No friction rub. No gallop.   Pulmonary:     Breath sounds: No stridor. No wheezing or rales.  Chest:     Chest wall: No tenderness.  Abdominal:     General: There is no distension.     Tenderness: There is no abdominal tenderness. There is no rebound.  Musculoskeletal:        General: Normal range of motion.     Cervical back: Neck supple.  Lymphadenopathy:     Cervical: No cervical adenopathy.  Skin:    Findings: No erythema or rash.  Neurological:      Mental Status: He is alert and oriented to person, place, and time.     Motor: No abnormal muscle tone.     Coordination: Coordination normal.  Psychiatric:        Behavior: Behavior normal.     ED Results / Procedures / Treatments   Labs (all labs ordered are listed, but only abnormal results are displayed) Labs Reviewed  CBC WITH DIFFERENTIAL/PLATELET - Abnormal; Notable for the following components:      Result Value   RDW 18.9 (*)    All other components within normal limits  COMPREHENSIVE METABOLIC PANEL - Abnormal; Notable for the following components:   Glucose, Bld 123 (*)    Calcium 8.3 (*)  Total Protein 6.0 (*)    Albumin 3.4 (*)    All other components within normal limits    EKG None  Radiology CT Head Wo Contrast  Result Date: 05/09/2019 CLINICAL DATA:  Headache, acute normal neuro exam. EXAM: CT HEAD WITHOUT CONTRAST TECHNIQUE: Contiguous axial images were obtained from the base of the skull through the vertex without intravenous contrast. COMPARISON:  04/21/2014 FINDINGS: Brain: No evidence of acute infarction, hemorrhage, hydrocephalus, extra-axial collection or mass lesion/mass effect. Vascular: No hyperdense vessel or unexpected calcification. Skull: Normal. Negative for fracture or focal lesion. Sinuses/Orbits: Scattered ethmoid sinus disease. Orbits are normal. Other: None IMPRESSION: 1. No acute intracranial pathology. 2. Scattered ethmoid sinus disease. Electronically Signed   By: Zetta Bills M.D.   On: 05/09/2019 12:09    Procedures Procedures (including critical care time)  Medications Ordered in ED Medications  sodium chloride 0.9 % bolus 1,000 mL (1,000 mLs Intravenous New Bag/Given 05/09/19 1146)    ED Course  I have reviewed the triage vital signs and the nursing notes.  Pertinent labs & imaging results that were available during my care of the patient were reviewed by me and considered in my medical decision making (see chart for  details).    MDM Rules/Calculators/A&P                      Patient with syncope secondary to plasma donation.  Patient improved with fluids labs unremarkable CT scan unremarkable Final Clinical Impression(s) / ED Diagnoses Final diagnoses:  Syncope, unspecified syncope type    Rx / DC Orders ED Discharge Orders    None       Milton Ferguson, MD 05/09/19 1423
